# Patient Record
Sex: Female | Born: 1965 | Race: Black or African American | Hispanic: No | Smoking: Never smoker
Health system: Southern US, Community
[De-identification: ages and names within clinical notes are randomized; demographics above are authoritative.]

## PROBLEM LIST (undated history)

## (undated) DIAGNOSIS — T8859XA Other complications of anesthesia, initial encounter: Secondary | ICD-10-CM

## (undated) DIAGNOSIS — D241 Benign neoplasm of right breast: Secondary | ICD-10-CM

## (undated) DIAGNOSIS — R519 Headache, unspecified: Secondary | ICD-10-CM

## (undated) DIAGNOSIS — N938 Other specified abnormal uterine and vaginal bleeding: Secondary | ICD-10-CM

## (undated) DIAGNOSIS — M1990 Unspecified osteoarthritis, unspecified site: Secondary | ICD-10-CM

## (undated) DIAGNOSIS — D649 Anemia, unspecified: Secondary | ICD-10-CM

## (undated) DIAGNOSIS — R112 Nausea with vomiting, unspecified: Secondary | ICD-10-CM

## (undated) DIAGNOSIS — K219 Gastro-esophageal reflux disease without esophagitis: Secondary | ICD-10-CM

## (undated) DIAGNOSIS — Z9889 Other specified postprocedural states: Secondary | ICD-10-CM

## (undated) DIAGNOSIS — D0511 Intraductal carcinoma in situ of right breast: Secondary | ICD-10-CM

## (undated) DIAGNOSIS — E785 Hyperlipidemia, unspecified: Secondary | ICD-10-CM

## (undated) DIAGNOSIS — I10 Essential (primary) hypertension: Secondary | ICD-10-CM

## (undated) DIAGNOSIS — E7800 Pure hypercholesterolemia, unspecified: Secondary | ICD-10-CM

## (undated) DIAGNOSIS — K589 Irritable bowel syndrome without diarrhea: Secondary | ICD-10-CM

## (undated) HISTORY — PX: ABDOMINAL HYSTERECTOMY: SHX81

## (undated) HISTORY — DX: Irritable bowel syndrome, unspecified: K58.9

## (undated) HISTORY — DX: Intraductal carcinoma in situ of right breast: D05.11

## (undated) HISTORY — PX: COLONOSCOPY: SHX174

## (undated) HISTORY — DX: Benign neoplasm of right breast: D24.1

## (undated) HISTORY — PX: BREAST BIOPSY: SHX20

---

## 1984-12-21 HISTORY — PX: BREAST BIOPSY: SHX20

## 2009-12-21 HISTORY — PX: COLONOSCOPY: SHX174

## 2011-12-22 HISTORY — PX: BREAST EXCISIONAL BIOPSY: SUR124

## 2012-01-21 DIAGNOSIS — D249 Benign neoplasm of unspecified breast: Secondary | ICD-10-CM

## 2012-01-21 HISTORY — DX: Benign neoplasm of unspecified breast: D24.9

## 2014-12-21 HISTORY — PX: ABDOMINAL HYSTERECTOMY: SHX81

## 2016-11-23 ENCOUNTER — Other Ambulatory Visit: Payer: Self-pay | Admitting: Family Medicine

## 2016-11-23 DIAGNOSIS — Z1231 Encounter for screening mammogram for malignant neoplasm of breast: Secondary | ICD-10-CM

## 2016-12-17 ENCOUNTER — Ambulatory Visit
Admission: RE | Admit: 2016-12-17 | Discharge: 2016-12-17 | Disposition: A | Discharge status: Home / Self Care | Payer: BLUE CROSS/BLUE SHIELD | Source: Ambulatory Visit | Attending: Family Medicine | Admitting: Family Medicine

## 2016-12-17 DIAGNOSIS — Z1231 Encounter for screening mammogram for malignant neoplasm of breast: Secondary | ICD-10-CM | POA: Insufficient documentation

## 2016-12-25 ENCOUNTER — Inpatient Hospital Stay
Admission: RE | Admit: 2016-12-25 | Discharge: 2016-12-25 | Disposition: A | Discharge status: Home / Self Care | Payer: Self-pay | Source: Ambulatory Visit | Attending: *Deleted | Admitting: *Deleted

## 2016-12-25 ENCOUNTER — Other Ambulatory Visit: Payer: Self-pay | Admitting: *Deleted

## 2016-12-25 DIAGNOSIS — Z9289 Personal history of other medical treatment: Secondary | ICD-10-CM

## 2018-02-11 ENCOUNTER — Other Ambulatory Visit: Payer: Self-pay | Admitting: Internal Medicine

## 2018-02-11 DIAGNOSIS — Z1239 Encounter for other screening for malignant neoplasm of breast: Secondary | ICD-10-CM

## 2020-02-21 ENCOUNTER — Other Ambulatory Visit: Payer: Self-pay | Admitting: Infectious Diseases

## 2020-02-21 DIAGNOSIS — Z1231 Encounter for screening mammogram for malignant neoplasm of breast: Secondary | ICD-10-CM

## 2020-03-07 ENCOUNTER — Ambulatory Visit
Admission: RE | Admit: 2020-03-07 | Discharge: 2020-03-07 | Disposition: A | Discharge status: Home / Self Care | Payer: BC Managed Care – PPO | Source: Ambulatory Visit | Attending: Infectious Diseases | Admitting: Infectious Diseases

## 2020-03-07 DIAGNOSIS — Z1231 Encounter for screening mammogram for malignant neoplasm of breast: Secondary | ICD-10-CM | POA: Insufficient documentation

## 2020-03-08 ENCOUNTER — Other Ambulatory Visit: Payer: Self-pay | Admitting: Family Medicine

## 2020-03-14 ENCOUNTER — Other Ambulatory Visit: Payer: Self-pay | Admitting: Family Medicine

## 2020-03-15 ENCOUNTER — Other Ambulatory Visit: Payer: Self-pay | Admitting: Infectious Diseases

## 2020-03-15 DIAGNOSIS — N6452 Nipple discharge: Secondary | ICD-10-CM

## 2020-04-11 ENCOUNTER — Ambulatory Visit
Admission: RE | Admit: 2020-04-11 | Discharge: 2020-04-11 | Disposition: A | Discharge status: Home / Self Care | Payer: BC Managed Care – PPO | Source: Ambulatory Visit | Attending: Infectious Diseases | Admitting: Infectious Diseases

## 2020-04-11 DIAGNOSIS — N6452 Nipple discharge: Secondary | ICD-10-CM | POA: Diagnosis not present

## 2020-04-15 ENCOUNTER — Other Ambulatory Visit: Payer: Self-pay | Admitting: Infectious Diseases

## 2020-04-15 DIAGNOSIS — N6452 Nipple discharge: Secondary | ICD-10-CM

## 2020-04-19 DIAGNOSIS — K635 Polyp of colon: Secondary | ICD-10-CM

## 2020-04-19 HISTORY — DX: Polyp of colon: K63.5

## 2020-04-22 ENCOUNTER — Other Ambulatory Visit: Payer: Self-pay | Admitting: Infectious Diseases

## 2020-04-22 DIAGNOSIS — R928 Other abnormal and inconclusive findings on diagnostic imaging of breast: Secondary | ICD-10-CM

## 2020-04-22 DIAGNOSIS — N6320 Unspecified lump in the left breast, unspecified quadrant: Secondary | ICD-10-CM

## 2020-04-29 ENCOUNTER — Ambulatory Visit
Admission: RE | Admit: 2020-04-29 | Discharge: 2020-04-29 | Disposition: A | Discharge status: Home / Self Care | Payer: BC Managed Care – PPO | Source: Ambulatory Visit | Attending: Infectious Diseases | Admitting: Infectious Diseases

## 2020-04-29 ENCOUNTER — Other Ambulatory Visit: Payer: Self-pay

## 2020-04-29 DIAGNOSIS — N6452 Nipple discharge: Secondary | ICD-10-CM | POA: Insufficient documentation

## 2020-04-29 MED ORDER — GADOBUTROL 1 MMOL/ML IV SOLN
5.0000 mL | Freq: Once | INTRAVENOUS | Status: AC | PRN
  Administered 2020-04-29: 5 mL via INTRAVENOUS

## 2020-05-09 ENCOUNTER — Other Ambulatory Visit: Payer: Self-pay | Admitting: Infectious Diseases

## 2020-05-09 DIAGNOSIS — R9389 Abnormal findings on diagnostic imaging of other specified body structures: Secondary | ICD-10-CM

## 2020-05-17 ENCOUNTER — Ambulatory Visit
Admission: RE | Admit: 2020-05-17 | Discharge: 2020-05-17 | Disposition: A | Discharge status: Home / Self Care | Payer: BC Managed Care – PPO | Source: Ambulatory Visit | Attending: Infectious Diseases | Admitting: Infectious Diseases

## 2020-05-17 ENCOUNTER — Other Ambulatory Visit: Payer: Self-pay | Admitting: Neurology

## 2020-05-17 ENCOUNTER — Other Ambulatory Visit: Payer: Self-pay

## 2020-05-17 DIAGNOSIS — R9389 Abnormal findings on diagnostic imaging of other specified body structures: Secondary | ICD-10-CM

## 2020-05-17 HISTORY — PX: BREAST BIOPSY: SHX20

## 2020-05-17 MED ORDER — GADOBUTROL 1 MMOL/ML IV SOLN
6.0000 mL | Freq: Once | INTRAVENOUS | Status: AC | PRN
  Administered 2020-05-17: 6 mL via INTRAVENOUS

## 2020-05-26 HISTORY — PX: BREAST EXCISIONAL BIOPSY: SUR124

## 2020-05-28 ENCOUNTER — Ambulatory Visit: Payer: Self-pay | Admitting: General Surgery

## 2020-05-28 NOTE — H&P (Signed)
PATIENT PROFILE: Jacqueline Murray is a 54 y.o. female who presents to the Clinic for consultation at the request of Dr. Ola Spurr for evaluation of intraductal papilloma of the right breast.  PCP:  Angelena Form, MD  HISTORY OF PRESENT ILLNESS: Jacqueline Murray reports she has been having right nipple discharge for the last few months.  She reports that the discharge is clear [place].  She denies any pain.  There is no pain radiation.  There is no alleviating or aggravating factor.  The discharge is spontaneous mostly seen on her bra or pajamas.  Denies any skin changes.  Denies any palpable mass.  Due to this complaint she had diagnostic mammogram and ultrasound.  This did not show anything that explain the nipple discharge.  MRI was recommended.  MRI showed a lesion at the 9 o'clock position.  MRI guided biopsy was done showing intraductal papilloma with hyperplasia.  No atypia or malignancy.  Family history of breast cancer: None Family history of other cancers: Father prostate Menarche: 9 years old Menopause: 54 years old (hysterectomy due to fibroids and bleeding) Used OCP: Yes Used estrogen and progesterone therapy: Estradiol History of Radiation to the chest: None Previous breast biopsy: 3 on right breast, 1 left breast Age of first pregnancy: 54 years old (miscarriage) Number of points in total: 2  PROBLEM LIST:        Problem List  Date Reviewed: 09/29/2019       Noted   Anemia 04/19/2020   Colon polyp 04/19/2020   Overview    Benign      Dysfunctional uterine bleeding 04/19/2020   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   IBS (irritable bowel syndrome) 04/19/2020   Migraine 04/19/2020   Uterine leiomyoma 04/19/2020   Essential hypertension 02/11/2018   Pure hypercholesterolemia 02/11/2018   Hot flashes, menopausal 02/11/2018   Non-seasonal allergic rhinitis 02/11/2018   Gastroesophageal reflux disease without esophagitis 02/11/2018   S/P TAH-BSO 01/29/2015   Acne  11/21/2012   Intraductal papilloma of breast 01/21/2012   Overview    Left breast         GENERAL REVIEW OF SYSTEMS:   General ROS: negative for - chills, fatigue, fever, weight gain or weight loss Allergy and Immunology ROS: negative for - hives  Hematological and Lymphatic ROS: negative for - bleeding problems or bruising, negative for palpable nodes Endocrine ROS: negative for - heat or cold intolerance, hair changes Respiratory ROS: negative for - cough, shortness of breath or wheezing Cardiovascular ROS: no chest pain or palpitations GI ROS: negative for nausea, vomiting, abdominal pain, diarrhea, constipation Musculoskeletal ROS: negative for - joint swelling or muscle pain Neurological ROS: negative for - confusion, syncope Dermatological ROS: negative for pruritus and rash Psychiatric: negative for anxiety, depression, difficulty sleeping and memory loss  MEDICATIONS: Current Medications        Current Outpatient Medications  Medication Sig Dispense Refill  . estradioL (ESTRACE) 1 MG tablet Take 1 tablet (1 mg total) by mouth once daily 90 tablet 3  . loratadine (CLARITIN) 10 mg tablet Take 10 mg by mouth once daily      . montelukast (SINGULAIR) 10 mg tablet Take 1 tablet (10 mg total) by mouth nightly 90 tablet 1  . multivit/folic acid/vit K1 (ONE-A-DAY WOMEN'S 50 PLUS ORAL) Take 1 tablet by mouth once daily    . omeprazole (PRILOSEC) 20 MG DR capsule Take 20 mg by mouth once daily    . simvastatin (ZOCOR) 20 MG tablet Take 1 tablet (20 mg  total) by mouth nightly 90 tablet 3  . spironolactone (ALDACTONE) 100 MG tablet Take 1 tablet (100 mg total) by mouth once daily 90 tablet 3  . triamcinolone (NASACORT) 55 mcg nasal spray as needed     No current facility-administered medications for this visit.      ALLERGIES: Ace inhibitors, Povidone-iodine, Shellfish containing products, Latex, and Ramipril  PAST MEDICAL HISTORY:     Past Medical  History:  Diagnosis Date  . Allergy 2004  . Anemia 2000  . Arthritis   . GERD (gastroesophageal reflux disease) 2004  . History of blood transfusion 2000  . Hyperlipidemia   . Hypertension 2005    PAST SURGICAL HISTORY:      Past Surgical History:  Procedure Laterality Date  . CESAREAN SECTION  06/17/1994  . COLONOSCOPY  03/13/2010   @ [facility] - "benign polyps"  . HYSTERECTOMY  01/28/2015     FAMILY HISTORY:      Family History  Problem Relation Age of Onset  . Heart disease Mother   . Diabetes type I Mother   . Coronary Artery Disease (Blocked arteries around heart) Mother        Deceased  . Diabetes type II Mother        Deceased  . Diabetes Mother   . Prostate cancer Father        Deceased  . High blood pressure (Hypertension) Father        Deceased  . Hyperlipidemia (Elevated cholesterol) Father        Deceased  . High blood pressure (Hypertension) Sister   . Hyperlipidemia (Elevated cholesterol) Sister   . Heart disease Brother   . Diabetes type I Brother   . Kidney disease Brother   . Coronary Artery Disease (Blocked arteries around heart) Brother        Deceased  . Diabetes type II Brother        Deceased  . Diabetes Brother   . No Known Problems Brother   . No Known Problems Brother   . No Known Problems Brother      SOCIAL HISTORY: Social History          Socioeconomic History  . Marital status: Divorced    Spouse name: Not on file  . Number of children: 1  . Years of education: 57  . Highest education level: Master's degree (e.g., MA, MS, MEng, MEd, MSW, MBA)  Occupational History  . Occupation: Geologist, engineering    Comment: Geophysical data processor  Tobacco Use  . Smoking status: Never Smoker  . Smokeless tobacco: Never Used  Vaping Use  . Vaping Use: Never used  Substance and Sexual Activity  . Alcohol use: Not Currently  . Drug use: Never  . Sexual activity: Not Currently    Partners: Male     Birth control/protection: None  Other Topics Concern  . Not on file  Social History Narrative  . Not on file   Social Determinants of Health      Financial Resource Strain:   . Difficulty of Paying Living Expenses:   Food Insecurity:   . Worried About Charity fundraiser in the Last Year:   . Arboriculturist in the Last Year:   Transportation Needs:   . Film/video editor (Medical):   Marland Kitchen Lack of Transportation (Non-Medical):       PHYSICAL EXAM:    Vitals:   05/28/20 1005  BP: 115/82  Pulse: 73   Body mass index  is 24.14 kg/m. Weight: 59.9 kg (132 lb)   GENERAL: Alert, active, oriented x3  HEENT: Pupils equal reactive to light. Extraocular movements are intact. Sclera clear. Palpebral conjunctiva normal red color.Pharynx clear.  NECK: Supple with no palpable mass and no adenopathy.  LUNGS: Sound clear with no rales rhonchi or wheezes.  HEART: Regular rhythm S1 and S2 without murmur.  BREAST: breasts appear normal, no suspicious masses, no skin or nipple changes or axillary nodes.  ABDOMEN: Soft and depressible, nontender with no palpable mass, no hepatomegaly.  EXTREMITIES: Well-developed well-nourished symmetrical with no dependent edema.  NEUROLOGICAL: Awake alert oriented, facial expression symmetrical, moving all extremities.  REVIEW OF DATA: I have reviewed the following data today:      Appointment on 03/18/2020  Component Date Value  . WBC (White Blood Cell Co* 03/18/2020 6.1   . RBC (Red Blood Cell Coun* 03/18/2020 4.20   . Hemoglobin 03/18/2020 11.9*  . Hematocrit 03/18/2020 37.7   . MCV (Mean Corpuscular Vo* 03/18/2020 89.8   . MCH (Mean Corpuscular He* 03/18/2020 28.3   . MCHC (Mean Corpuscular H* 03/18/2020 31.6*  . Platelet Count 03/18/2020 209   . RDW-CV (Red Cell Distrib* 03/18/2020 12.9   . MPV (Mean Platelet Volum* 03/18/2020 11.4   . Neutrophils 03/18/2020 3.18   . Lymphocytes 03/18/2020 2.09   . Monocytes  03/18/2020 0.57   . Eosinophils 03/18/2020 0.22   . Basophils 03/18/2020 0.03   . Neutrophil % 03/18/2020 52.1   . Lymphocyte % 03/18/2020 34.3   . Monocyte % 03/18/2020 9.3   . Eosinophil % 03/18/2020 3.6   . Basophil% 03/18/2020 0.5   . Immature Granulocyte % 03/18/2020 0.2   . Immature Granulocyte Cou* 03/18/2020 0.01   . Glucose 03/18/2020 95   . Sodium 03/18/2020 139   . Potassium 03/18/2020 4.3   . Chloride 03/18/2020 108   . Carbon Dioxide (CO2) 03/18/2020 24.3   . Urea Nitrogen (BUN) 03/18/2020 18   . Creatinine 03/18/2020 1.0   . Glomerular Filtration Ra* 03/18/2020 70   . Calcium 03/18/2020 9.5   . AST  03/18/2020 24   . ALT  03/18/2020 23   . Alk Phos (alkaline Phosp* 03/18/2020 49   . Albumin 03/18/2020 4.2   . Bilirubin, Total 03/18/2020 0.9   . Protein, Total 03/18/2020 6.8   . A/G Ratio 03/18/2020 1.6   . Cholesterol, Total 03/18/2020 165   . Triglyceride 03/18/2020 87   . HDL (High Density Lipopr* 03/18/2020 46.9   . LDL Calculated 03/18/2020 101   . VLDL Cholesterol 03/18/2020 17   . Cholesterol/HDL Ratio 03/18/2020 3.5   . Thyroid Stimulating Horm* 03/18/2020 1.755      ASSESSMENT: Jacqueline Murray is a 54 y.o. female presenting for consultation for right breast intraductal papilloma.    Patient was oriented again about the pathology results. Surgical alternatives were discussed with patient including excisional biopsy. Surgical technique and post operative care was discussed with patient. Risk of surgery was discussed with patient including but not limited to: wound infection, seroma, hematoma, brachial plexopathy, mondor's disease (thrombosis of small veins of breast), chronic wound pain, breast lymphedema, altered sensation to the nipple and cosmesis among others.   Due to patient persistent discharge from the right nipple and the finding of intraductal papilloma excisional biopsy was recommended.  Patient oriented about the goal of ruling out any malignancy  and to decrease or resolve the nipple discharge.  Patient reports he understood and agreed to proceed.  Intraductal  papilloma of right breast [D24.1]  PLAN: 1. Right breast radiofrequency tag guided excisional biopsy 2. CBC, CMP done 3. Avoid taking aspirin 5 days before surgery 4. Contact us if has any question or concern.   Patient verbalized understanding, all questions were answered, and were agreeable with the plan outlined above.     Herbert Pun, MD  Electronically signed by Herbert Pun, MD

## 2020-05-28 NOTE — H&P (View-Only) (Signed)
PATIENT PROFILE: Jacqueline Murray is a 54 y.o. female who presents to the Clinic for consultation at the request of Dr. Ola Murray for evaluation of intraductal papilloma of the right breast.  PCP:  Jacqueline Form, MD  HISTORY OF PRESENT ILLNESS: Ms. Jacqueline Murray reports she has been having right nipple discharge for the last few months.  She reports that the discharge is clear [place].  She denies any pain.  There is no pain radiation.  There is no alleviating or aggravating factor.  The discharge is spontaneous mostly seen on her bra or pajamas.  Denies any skin changes.  Denies any palpable mass.  Due to this complaint she had diagnostic mammogram and ultrasound.  This did not show anything that explain the nipple discharge.  MRI was recommended.  MRI showed a lesion at the 9 o'clock position.  MRI guided biopsy was done showing intraductal papilloma with hyperplasia.  No atypia or malignancy.  Family history of breast cancer: None Family history of other cancers: Father prostate Menarche: 45 years old Menopause: 54 years old (hysterectomy due to fibroids and bleeding) Used OCP: Yes Used estrogen and progesterone therapy: Estradiol History of Radiation to the chest: None Previous breast biopsy: 3 on right breast, 1 left breast Age of first pregnancy: 54 years old (miscarriage) Number of points in total: 2  PROBLEM LIST:        Problem List  Date Reviewed: 09/29/2019       Noted   Anemia 04/19/2020   Colon polyp 04/19/2020   Overview    Benign      Dysfunctional uterine bleeding 04/19/2020   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   IBS (irritable bowel syndrome) 04/19/2020   Migraine 04/19/2020   Uterine leiomyoma 04/19/2020   Essential hypertension 02/11/2018   Pure hypercholesterolemia 02/11/2018   Hot flashes, menopausal 02/11/2018   Non-seasonal allergic rhinitis 02/11/2018   Gastroesophageal reflux disease without esophagitis 02/11/2018   S/P TAH-BSO 01/29/2015   Acne  11/21/2012   Intraductal papilloma of breast 01/21/2012   Overview    Left breast         GENERAL REVIEW OF SYSTEMS:   General ROS: negative for - chills, fatigue, fever, weight gain or weight loss Allergy and Immunology ROS: negative for - hives  Hematological and Lymphatic ROS: negative for - bleeding problems or bruising, negative for palpable nodes Endocrine ROS: negative for - heat or cold intolerance, hair changes Respiratory ROS: negative for - cough, shortness of breath or wheezing Cardiovascular ROS: no chest pain or palpitations GI ROS: negative for nausea, vomiting, abdominal pain, diarrhea, constipation Musculoskeletal ROS: negative for - joint swelling or muscle pain Neurological ROS: negative for - confusion, syncope Dermatological ROS: negative for pruritus and rash Psychiatric: negative for anxiety, depression, difficulty sleeping and memory loss  MEDICATIONS: Current Medications        Current Outpatient Medications  Medication Sig Dispense Refill  . estradioL (ESTRACE) 1 MG tablet Take 1 tablet (1 mg total) by mouth once daily 90 tablet 3  . loratadine (CLARITIN) 10 mg tablet Take 10 mg by mouth once daily      . montelukast (SINGULAIR) 10 mg tablet Take 1 tablet (10 mg total) by mouth nightly 90 tablet 1  . multivit/folic acid/vit K1 (ONE-A-DAY WOMEN'S 50 PLUS ORAL) Take 1 tablet by mouth once daily    . omeprazole (PRILOSEC) 20 MG DR capsule Take 20 mg by mouth once daily    . simvastatin (ZOCOR) 20 MG tablet Take 1 tablet (20 mg  total) by mouth nightly 90 tablet 3  . spironolactone (ALDACTONE) 100 MG tablet Take 1 tablet (100 mg total) by mouth once daily 90 tablet 3  . triamcinolone (NASACORT) 55 mcg nasal spray as needed     No current facility-administered medications for this visit.      ALLERGIES: Ace inhibitors, Povidone-iodine, Shellfish containing products, Latex, and Ramipril  PAST MEDICAL HISTORY:     Past Medical  History:  Diagnosis Date  . Allergy 2004  . Anemia 2000  . Arthritis   . GERD (gastroesophageal reflux disease) 2004  . History of blood transfusion 2000  . Hyperlipidemia   . Hypertension 2005    PAST SURGICAL HISTORY:      Past Surgical History:  Procedure Laterality Date  . CESAREAN SECTION  06/17/1994  . COLONOSCOPY  03/13/2010   @ [facility] - "benign polyps"  . HYSTERECTOMY  01/28/2015     FAMILY HISTORY:      Family History  Problem Relation Age of Onset  . Heart disease Mother   . Diabetes type I Mother   . Coronary Artery Disease (Blocked arteries around heart) Mother        Deceased  . Diabetes type II Mother        Deceased  . Diabetes Mother   . Prostate cancer Father        Deceased  . High blood pressure (Hypertension) Father        Deceased  . Hyperlipidemia (Elevated cholesterol) Father        Deceased  . High blood pressure (Hypertension) Sister   . Hyperlipidemia (Elevated cholesterol) Sister   . Heart disease Brother   . Diabetes type I Brother   . Kidney disease Brother   . Coronary Artery Disease (Blocked arteries around heart) Brother        Deceased  . Diabetes type II Brother        Deceased  . Diabetes Brother   . No Known Problems Brother   . No Known Problems Brother   . No Known Problems Brother      SOCIAL HISTORY: Social History          Socioeconomic History  . Marital status: Divorced    Spouse name: Not on file  . Number of children: 1  . Years of education: 52  . Highest education level: Master's degree (e.g., MA, MS, MEng, MEd, MSW, MBA)  Occupational History  . Occupation: Geologist, engineering    Comment: Geophysical data processor  Tobacco Use  . Smoking status: Never Smoker  . Smokeless tobacco: Never Used  Vaping Use  . Vaping Use: Never used  Substance and Sexual Activity  . Alcohol use: Not Currently  . Drug use: Never  . Sexual activity: Not Currently    Partners: Male     Birth control/protection: None  Other Topics Concern  . Not on file  Social History Narrative  . Not on file   Social Determinants of Health      Financial Resource Strain:   . Difficulty of Paying Living Expenses:   Food Insecurity:   . Worried About Charity fundraiser in the Last Year:   . Arboriculturist in the Last Year:   Transportation Needs:   . Film/video editor (Medical):   Marland Kitchen Lack of Transportation (Non-Medical):       PHYSICAL EXAM:    Vitals:   05/28/20 1005  BP: 115/82  Pulse: 73   Body mass index  is 24.14 kg/m. Weight: 59.9 kg (132 lb)   GENERAL: Alert, active, oriented x3  HEENT: Pupils equal reactive to light. Extraocular movements are intact. Sclera clear. Palpebral conjunctiva normal red color.Pharynx clear.  NECK: Supple with no palpable mass and no adenopathy.  LUNGS: Sound clear with no rales rhonchi or wheezes.  HEART: Regular rhythm S1 and S2 without murmur.  BREAST: breasts appear normal, no suspicious masses, no skin or nipple changes or axillary nodes.  ABDOMEN: Soft and depressible, nontender with no palpable mass, no hepatomegaly.  EXTREMITIES: Well-developed well-nourished symmetrical with no dependent edema.  NEUROLOGICAL: Awake alert oriented, facial expression symmetrical, moving all extremities.  REVIEW OF DATA: I have reviewed the following data today:      Appointment on 03/18/2020  Component Date Value  . WBC (White Blood Cell Co* 03/18/2020 6.1   . RBC (Red Blood Cell Coun* 03/18/2020 4.20   . Hemoglobin 03/18/2020 11.9*  . Hematocrit 03/18/2020 37.7   . MCV (Mean Corpuscular Vo* 03/18/2020 89.8   . MCH (Mean Corpuscular He* 03/18/2020 28.3   . MCHC (Mean Corpuscular H* 03/18/2020 31.6*  . Platelet Count 03/18/2020 209   . RDW-CV (Red Cell Distrib* 03/18/2020 12.9   . MPV (Mean Platelet Volum* 03/18/2020 11.4   . Neutrophils 03/18/2020 3.18   . Lymphocytes 03/18/2020 2.09   . Monocytes  03/18/2020 0.57   . Eosinophils 03/18/2020 0.22   . Basophils 03/18/2020 0.03   . Neutrophil % 03/18/2020 52.1   . Lymphocyte % 03/18/2020 34.3   . Monocyte % 03/18/2020 9.3   . Eosinophil % 03/18/2020 3.6   . Basophil% 03/18/2020 0.5   . Immature Granulocyte % 03/18/2020 0.2   . Immature Granulocyte Cou* 03/18/2020 0.01   . Glucose 03/18/2020 95   . Sodium 03/18/2020 139   . Potassium 03/18/2020 4.3   . Chloride 03/18/2020 108   . Carbon Dioxide (CO2) 03/18/2020 24.3   . Urea Nitrogen (BUN) 03/18/2020 18   . Creatinine 03/18/2020 1.0   . Glomerular Filtration Ra* 03/18/2020 70   . Calcium 03/18/2020 9.5   . AST  03/18/2020 24   . ALT  03/18/2020 23   . Alk Phos (alkaline Phosp* 03/18/2020 49   . Albumin 03/18/2020 4.2   . Bilirubin, Total 03/18/2020 0.9   . Protein, Total 03/18/2020 6.8   . A/G Ratio 03/18/2020 1.6   . Cholesterol, Total 03/18/2020 165   . Triglyceride 03/18/2020 87   . HDL (High Density Lipopr* 03/18/2020 46.9   . LDL Calculated 03/18/2020 101   . VLDL Cholesterol 03/18/2020 17   . Cholesterol/HDL Ratio 03/18/2020 3.5   . Thyroid Stimulating Horm* 03/18/2020 1.755      ASSESSMENT: Jacqueline Murray is a 54 y.o. female presenting for consultation for right breast intraductal papilloma.    Patient was oriented again about the pathology results. Surgical alternatives were discussed with patient including excisional biopsy. Surgical technique and post operative care was discussed with patient. Risk of surgery was discussed with patient including but not limited to: wound infection, seroma, hematoma, brachial plexopathy, mondor's disease (thrombosis of small veins of breast), chronic wound pain, breast lymphedema, altered sensation to the nipple and cosmesis among others.   Due to patient persistent discharge from the right nipple and the finding of intraductal papilloma excisional biopsy was recommended.  Patient oriented about the goal of ruling out any malignancy  and to decrease or resolve the nipple discharge.  Patient reports he understood and agreed to proceed.  Intraductal  papilloma of right breast [D24.1]  PLAN: 1. Right breast radiofrequency tag guided excisional biopsy 2. CBC, CMP done 3. Avoid taking aspirin 5 days before surgery 4. Contact us if has any question or concern.   Patient verbalized understanding, all questions were answered, and were agreeable with the plan outlined above.     Herbert Pun, MD  Electronically signed by Herbert Pun, MD

## 2020-05-29 ENCOUNTER — Other Ambulatory Visit: Payer: Self-pay | Admitting: General Surgery

## 2020-05-29 DIAGNOSIS — D241 Benign neoplasm of right breast: Secondary | ICD-10-CM

## 2020-05-31 ENCOUNTER — Encounter
Admission: RE | Admit: 2020-05-31 | Discharge: 2020-05-31 | Disposition: A | Discharge status: Home / Self Care | Payer: BC Managed Care – PPO | Source: Ambulatory Visit | Attending: General Surgery | Admitting: General Surgery

## 2020-05-31 ENCOUNTER — Other Ambulatory Visit: Payer: Self-pay

## 2020-05-31 HISTORY — DX: Essential (primary) hypertension: I10

## 2020-05-31 HISTORY — DX: Nausea with vomiting, unspecified: R11.2

## 2020-05-31 HISTORY — DX: Other complications of anesthesia, initial encounter: T88.59XA

## 2020-05-31 HISTORY — DX: Hyperlipidemia, unspecified: E78.5

## 2020-05-31 HISTORY — DX: Other specified postprocedural states: Z98.890

## 2020-05-31 HISTORY — DX: Gastro-esophageal reflux disease without esophagitis: K21.9

## 2020-05-31 HISTORY — DX: Anemia, unspecified: D64.9

## 2020-05-31 NOTE — Patient Instructions (Signed)
Your procedure is scheduled on: 06/05/20 Report to [facility]. To find out your arrival time please call [phone] between 1PM - 3PM on 06/04/20.  Remember: Instructions that are not followed completely may result in serious medical risk, up to and including death, or upon the discretion of your surgeon and anesthesiologist your surgery may need to be rescheduled.     _X__ 1. Do not eat food after midnight the night before your procedure.                 No gum chewing or hard candies. You may drink clear liquids up to 2 hours                 before you are scheduled to arrive for your surgery- DO not drink clear                 liquids within 2 hours of the start of your surgery.                 Clear Liquids include:  water, apple juice without pulp, clear carbohydrate                 drink such as Clearfast or Gatorade, Black Coffee or Tea (Do not add                 anything to coffee or tea). Diabetics water only  __X__2.  On the morning of surgery brush your teeth with toothpaste and water, you                 may rinse your mouth with mouthwash if you wish.  Do not swallow any              toothpaste of mouthwash.     _X__ 3.  No Alcohol for 24 hours before or after surgery.   _X__ 4.  Do Not Smoke or use e-cigarettes For 24 Hours Prior to Your Surgery.                 Do not use any chewable tobacco products for at least 6 hours prior to                 surgery.  ____  5.  Bring all medications with you on the day of surgery if instructed.   __X__  6.  Notify your doctor if there is any change in your medical condition      (cold, fever, infections).     Do not wear jewelry, make-up, hairpins, clips or nail polish. Do not wear lotions, powders, or perfumes.  Do not shave 48 hours prior to surgery. Men may shave face and neck. Do not bring valuables to the hospital.    [facility] is not responsible for any belongings or  valuables.  Contacts, dentures/partials or body piercings may not be worn into surgery. Bring a case for your contacts, glasses or hearing aids, a denture cup will be supplied. Leave your suitcase in the car. After surgery it may be brought to your room. For patients admitted to the hospital, discharge time is determined by your treatment team.   Patients discharged the day of surgery will not be allowed to drive home.   Please read over the following fact sheets that you were given:   MRSA Information  __X__ Take these medicines the morning of surgery with A SIP OF WATER:  1. omeprazole (PRILOSEC) 20 MG capsule  2.   3.   4.  5.  6.  ____ Fleet Enema (as directed)   __X__ Use CHG Soap/SAGE wipes as directed  ____ Use inhalers on the day of surgery  ____ Stop metformin/Janumet/Farxiga 2 days prior to surgery    ____ Take 1/2 of usual insulin dose the night before surgery. No insulin the morning          of surgery.   ____ Stop Blood Thinners Coumadin/Plavix/Xarelto/Pleta/Pradaxa/Eliquis/Effient/Aspirin  on   Or contact your Surgeon, Cardiologist or Medical Doctor regarding  ability to stop your blood thinners  __X__ Stop Anti-inflammatories 7 days before surgery such as Advil, Ibuprofen, Motrin,  BC or Goodies Powder, Naprosyn, Naproxen, Aleve, Aspirin    __X__ Stop all herbal supplements, fish oil or vitamin E until after surgery.    ____ Bring C-Pap to the hospital.

## 2020-06-03 ENCOUNTER — Other Ambulatory Visit: Payer: BC Managed Care – PPO

## 2020-06-03 ENCOUNTER — Other Ambulatory Visit: Payer: Self-pay

## 2020-06-03 ENCOUNTER — Other Ambulatory Visit
Admission: RE | Admit: 2020-06-03 | Discharge: 2020-06-03 | Disposition: A | Discharge status: Home / Self Care | Payer: BC Managed Care – PPO | Source: Ambulatory Visit | Attending: General Surgery | Admitting: General Surgery

## 2020-06-03 ENCOUNTER — Ambulatory Visit
Admission: RE | Admit: 2020-06-03 | Discharge: 2020-06-03 | Disposition: A | Discharge status: Home / Self Care | Payer: BC Managed Care – PPO | Source: Ambulatory Visit | Attending: General Surgery | Admitting: General Surgery

## 2020-06-03 DIAGNOSIS — D241 Benign neoplasm of right breast: Secondary | ICD-10-CM | POA: Diagnosis present

## 2020-06-03 DIAGNOSIS — Z20822 Contact with and (suspected) exposure to covid-19: Secondary | ICD-10-CM | POA: Diagnosis not present

## 2020-06-03 DIAGNOSIS — Z01818 Encounter for other preprocedural examination: Secondary | ICD-10-CM | POA: Insufficient documentation

## 2020-06-03 DIAGNOSIS — Z01810 Encounter for preprocedural cardiovascular examination: Secondary | ICD-10-CM | POA: Diagnosis not present

## 2020-06-04 LAB — SARS CORONAVIRUS 2 (TAT 6-24 HRS): SARS Coronavirus 2: NEGATIVE

## 2020-06-05 ENCOUNTER — Ambulatory Visit
Admission: RE | Admit: 2020-06-05 | Discharge: 2020-06-05 | Disposition: A | Discharge status: Home / Self Care | Payer: BC Managed Care – PPO | Source: Ambulatory Visit | Attending: General Surgery | Admitting: General Surgery

## 2020-06-05 ENCOUNTER — Other Ambulatory Visit: Payer: Self-pay

## 2020-06-05 ENCOUNTER — Ambulatory Visit: Payer: BC Managed Care – PPO | Admitting: Certified Registered"

## 2020-06-05 ENCOUNTER — Encounter
Admission: RE | Disposition: A | Discharge status: Home / Self Care | Payer: Self-pay | Source: Home / Self Care | Attending: General Surgery

## 2020-06-05 ENCOUNTER — Ambulatory Visit
Admission: RE | Admit: 2020-06-05 | Discharge: 2020-06-05 | Disposition: A | Discharge status: Home / Self Care | Payer: BC Managed Care – PPO | Attending: General Surgery | Admitting: General Surgery

## 2020-06-05 DIAGNOSIS — Z79890 Hormone replacement therapy (postmenopausal): Secondary | ICD-10-CM | POA: Diagnosis not present

## 2020-06-05 DIAGNOSIS — Z888 Allergy status to other drugs, medicaments and biological substances status: Secondary | ICD-10-CM | POA: Diagnosis not present

## 2020-06-05 DIAGNOSIS — Z79899 Other long term (current) drug therapy: Secondary | ICD-10-CM | POA: Insufficient documentation

## 2020-06-05 DIAGNOSIS — M1990 Unspecified osteoarthritis, unspecified site: Secondary | ICD-10-CM | POA: Diagnosis not present

## 2020-06-05 DIAGNOSIS — D241 Benign neoplasm of right breast: Secondary | ICD-10-CM | POA: Diagnosis not present

## 2020-06-05 DIAGNOSIS — E785 Hyperlipidemia, unspecified: Secondary | ICD-10-CM | POA: Diagnosis not present

## 2020-06-05 DIAGNOSIS — I10 Essential (primary) hypertension: Secondary | ICD-10-CM | POA: Insufficient documentation

## 2020-06-05 DIAGNOSIS — K219 Gastro-esophageal reflux disease without esophagitis: Secondary | ICD-10-CM | POA: Insufficient documentation

## 2020-06-05 DIAGNOSIS — E7800 Pure hypercholesterolemia, unspecified: Secondary | ICD-10-CM | POA: Diagnosis not present

## 2020-06-05 HISTORY — PX: BREAST BIOPSY WITH RADIO FREQUENCY LOCALIZER: SHX6895

## 2020-06-05 SURGERY — BREAST BIOPSY WITH RADIO FREQUENCY LOCALIZER
Anesthesia: General | Laterality: Right

## 2020-06-05 MED ORDER — FENTANYL CITRATE (PF) 100 MCG/2ML IJ SOLN
25.0000 ug | INTRAMUSCULAR | Status: DC | PRN
  Administered 2020-06-05 (×3): 25 ug via INTRAVENOUS

## 2020-06-05 MED ORDER — OXYCODONE HCL 5 MG/5ML PO SOLN: 5.0000 mg | Freq: Once | ORAL | Status: AC | PRN

## 2020-06-05 MED ORDER — LIDOCAINE HCL (CARDIAC) PF 100 MG/5ML IV SOSY
PREFILLED_SYRINGE | INTRAVENOUS | Status: DC | PRN
  Administered 2020-06-05: 30 mg via INTRAVENOUS

## 2020-06-05 MED ORDER — PHENYLEPHRINE HCL (PRESSORS) 10 MG/ML IV SOLN
INTRAVENOUS | Status: DC | PRN
  Administered 2020-06-05 (×3): 100 ug via INTRAVENOUS

## 2020-06-05 MED ORDER — FENTANYL CITRATE (PF) 100 MCG/2ML IJ SOLN
INTRAMUSCULAR | Status: AC
  Filled 2020-06-05: qty 2

## 2020-06-05 MED ORDER — SCOPOLAMINE 1 MG/3DAYS TD PT72: 1.0000 | MEDICATED_PATCH | Freq: Once | TRANSDERMAL | Status: DC

## 2020-06-05 MED ORDER — PROPOFOL 10 MG/ML IV BOLUS
INTRAVENOUS | Status: AC
  Filled 2020-06-05: qty 20

## 2020-06-05 MED ORDER — PROPOFOL 500 MG/50ML IV EMUL
INTRAVENOUS | Status: AC
  Filled 2020-06-05: qty 50

## 2020-06-05 MED ORDER — ACETAMINOPHEN 10 MG/ML IV SOLN
INTRAVENOUS | Status: DC | PRN
  Administered 2020-06-05: 1000 mg via INTRAVENOUS

## 2020-06-05 MED ORDER — BUPIVACAINE HCL (PF) 0.5 % IJ SOLN
INTRAMUSCULAR | Status: AC
  Filled 2020-06-05: qty 30

## 2020-06-05 MED ORDER — FENTANYL CITRATE (PF) 100 MCG/2ML IJ SOLN
INTRAMUSCULAR | Status: DC | PRN
  Administered 2020-06-05 (×2): 50 ug via INTRAVENOUS

## 2020-06-05 MED ORDER — EPINEPHRINE PF 1 MG/ML IJ SOLN
INTRAMUSCULAR | Status: AC
  Filled 2020-06-05: qty 1

## 2020-06-05 MED ORDER — CHLORHEXIDINE GLUCONATE 0.12 % MT SOLN: 15.0000 mL | Freq: Once | OROMUCOSAL | Status: AC

## 2020-06-05 MED ORDER — DEXAMETHASONE SODIUM PHOSPHATE 10 MG/ML IJ SOLN
INTRAMUSCULAR | Status: DC | PRN
  Administered 2020-06-05: 5 mg via INTRAVENOUS

## 2020-06-05 MED ORDER — ONDANSETRON HCL 4 MG/2ML IJ SOLN
INTRAMUSCULAR | Status: AC
  Filled 2020-06-05: qty 2

## 2020-06-05 MED ORDER — ACETAMINOPHEN 10 MG/ML IV SOLN
INTRAVENOUS | Status: AC
  Filled 2020-06-05: qty 100

## 2020-06-05 MED ORDER — ORAL CARE MOUTH RINSE: 15.0000 mL | Freq: Once | OROMUCOSAL | Status: AC

## 2020-06-05 MED ORDER — LIDOCAINE HCL (PF) 2 % IJ SOLN
INTRAMUSCULAR | Status: AC
  Filled 2020-06-05: qty 5

## 2020-06-05 MED ORDER — PROPOFOL 10 MG/ML IV BOLUS
INTRAVENOUS | Status: DC | PRN
  Administered 2020-06-05: 150 mg via INTRAVENOUS

## 2020-06-05 MED ORDER — SCOPOLAMINE 1 MG/3DAYS TD PT72
MEDICATED_PATCH | TRANSDERMAL | Status: AC
  Administered 2020-06-05: 1.5 mg via TRANSDERMAL
  Filled 2020-06-05: qty 1

## 2020-06-05 MED ORDER — MEPERIDINE HCL 50 MG/ML IJ SOLN: 6.2500 mg | INTRAMUSCULAR | Status: DC | PRN

## 2020-06-05 MED ORDER — DEXAMETHASONE SODIUM PHOSPHATE 10 MG/ML IJ SOLN
INTRAMUSCULAR | Status: AC
  Filled 2020-06-05: qty 1

## 2020-06-05 MED ORDER — OXYCODONE HCL 5 MG PO TABS
5.0000 mg | ORAL_TABLET | Freq: Once | ORAL | Status: AC | PRN
  Administered 2020-06-05: 5 mg via ORAL

## 2020-06-05 MED ORDER — ONDANSETRON 4 MG PO TBDP
4.0000 mg | ORAL_TABLET | Freq: Three times a day (TID) | ORAL | 0 refills | Status: DC | PRN
Start: 2020-06-05 — End: 2022-07-13

## 2020-06-05 MED ORDER — ONDANSETRON HCL 4 MG/2ML IJ SOLN
INTRAMUSCULAR | Status: DC | PRN
  Administered 2020-06-05: 4 mg via INTRAVENOUS

## 2020-06-05 MED ORDER — MIDAZOLAM HCL 2 MG/2ML IJ SOLN
INTRAMUSCULAR | Status: AC
  Filled 2020-06-05: qty 2

## 2020-06-05 MED ORDER — OXYCODONE HCL 5 MG PO TABS
ORAL_TABLET | ORAL | Status: AC
  Filled 2020-06-05: qty 1

## 2020-06-05 MED ORDER — LACTATED RINGERS IV SOLN: INTRAVENOUS | Status: DC

## 2020-06-05 MED ORDER — CEFAZOLIN SODIUM-DEXTROSE 2-4 GM/100ML-% IV SOLN
INTRAVENOUS | Status: AC
  Filled 2020-06-05: qty 100

## 2020-06-05 MED ORDER — CHLORHEXIDINE GLUCONATE 0.12 % MT SOLN
OROMUCOSAL | Status: AC
  Administered 2020-06-05: 15 mL via OROMUCOSAL
  Filled 2020-06-05: qty 15

## 2020-06-05 MED ORDER — FENTANYL CITRATE (PF) 100 MCG/2ML IJ SOLN
INTRAMUSCULAR | Status: AC
  Administered 2020-06-05: 25 ug via INTRAVENOUS
  Filled 2020-06-05: qty 2

## 2020-06-05 MED ORDER — HYDROCODONE-ACETAMINOPHEN 5-325 MG PO TABS: 1.0000 | ORAL_TABLET | ORAL | 0 refills | Status: AC | PRN

## 2020-06-05 MED ORDER — MIDAZOLAM HCL 2 MG/2ML IJ SOLN
INTRAMUSCULAR | Status: DC | PRN
  Administered 2020-06-05: 2 mg via INTRAVENOUS

## 2020-06-05 MED ORDER — BUPIVACAINE-EPINEPHRINE (PF) 0.5% -1:200000 IJ SOLN
INTRAMUSCULAR | Status: DC | PRN
  Administered 2020-06-05: 30 mL via PERINEURAL

## 2020-06-05 MED ORDER — PROMETHAZINE HCL 25 MG/ML IJ SOLN: 6.2500 mg | INTRAMUSCULAR | Status: DC | PRN

## 2020-06-05 MED ORDER — PROPOFOL 500 MG/50ML IV EMUL
INTRAVENOUS | Status: DC | PRN
  Administered 2020-06-05: 160 ug/kg/min via INTRAVENOUS

## 2020-06-05 MED ORDER — CEFAZOLIN SODIUM-DEXTROSE 2-4 GM/100ML-% IV SOLN
2.0000 g | INTRAVENOUS | Status: AC
  Administered 2020-06-05: 2 g via INTRAVENOUS

## 2020-06-05 SURGICAL SUPPLY — 44 items
BLADE SURG 15 STRL LF DISP TIS (BLADE) ×1 IMPLANT
BLADE SURG 15 STRL SS (BLADE) ×2
CANISTER SUCT 1200ML W/VALVE (MISCELLANEOUS) ×3 IMPLANT
CHLORAPREP W/TINT 26 (MISCELLANEOUS) ×3 IMPLANT
CNTNR SPEC 2.5X3XGRAD LEK (MISCELLANEOUS) ×1
CONT SPEC 4OZ STER OR WHT (MISCELLANEOUS) ×2
CONTAINER SPEC 2.5X3XGRAD LEK (MISCELLANEOUS) ×1 IMPLANT
COVER WAND RF STERILE (DRAPES) ×3 IMPLANT
DERMABOND ADVANCED (GAUZE/BANDAGES/DRESSINGS) ×2
DERMABOND ADVANCED .7 DNX12 (GAUZE/BANDAGES/DRESSINGS) ×1 IMPLANT
DEVICE DUBIN SPECIMEN MAMMOGRA (MISCELLANEOUS) ×3 IMPLANT
DRAPE LAPAROTOMY TRNSV 106X77 (MISCELLANEOUS) ×3 IMPLANT
ELECT CAUTERY BLADE TIP 2.5 (TIP) ×3
ELECT REM PT RETURN 9FT ADLT (ELECTROSURGICAL) ×3
ELECTRODE CAUTERY BLDE TIP 2.5 (TIP) ×1 IMPLANT
ELECTRODE REM PT RTRN 9FT ADLT (ELECTROSURGICAL) ×1 IMPLANT
GLOVE BIO SURGEON STRL SZ 6.5 (GLOVE) ×2 IMPLANT
GLOVE BIO SURGEONS STRL SZ 6.5 (GLOVE) ×1
GLOVE BIOGEL PI IND STRL 6.5 (GLOVE) ×1 IMPLANT
GLOVE BIOGEL PI INDICATOR 6.5 (GLOVE) ×2
GOWN STRL REUS W/ TWL LRG LVL3 (GOWN DISPOSABLE) ×3 IMPLANT
GOWN STRL REUS W/TWL LRG LVL3 (GOWN DISPOSABLE) ×6
KIT MARKER MARGIN INK (KITS) IMPLANT
KIT TURNOVER KIT A (KITS) ×3 IMPLANT
LABEL OR SOLS (LABEL) ×3 IMPLANT
MARGIN MAP 10MM (MISCELLANEOUS) IMPLANT
MARKER MARGIN CORRECT CLIP (MARKER) IMPLANT
NDL HYPO 25X1 1.5 SAFETY (NEEDLE) ×1 IMPLANT
NEEDLE HYPO 25X1 1.5 SAFETY (NEEDLE) ×3 IMPLANT
PACK BASIN MINOR (MISCELLANEOUS) ×3 IMPLANT
RETRACTOR RING XSMALL (MISCELLANEOUS) IMPLANT
RTRCTR WOUND ALEXIS 13CM XS SH (MISCELLANEOUS) ×3
SET LOCALIZER 20 PROBE US (MISCELLANEOUS) ×3 IMPLANT
SUT ETHILON 3-0 FS-10 30 BLK (SUTURE)
SUT MNCRL 4-0 (SUTURE) ×2
SUT MNCRL 4-0 27XMFL (SUTURE) ×1
SUT SILK 2 0 SH (SUTURE) ×3 IMPLANT
SUT VIC AB 3-0 SH 27 (SUTURE) ×4
SUT VIC AB 3-0 SH 27X BRD (SUTURE) ×1 IMPLANT
SUTURE EHLN 3-0 FS-10 30 BLK (SUTURE) IMPLANT
SUTURE MNCRL 4-0 27XMF (SUTURE) ×1 IMPLANT
SYR 10ML LL (SYRINGE) ×3 IMPLANT
SYR BULB IRRIG 60ML STRL (SYRINGE) ×3 IMPLANT
WATER STERILE IRR 1000ML POUR (IV SOLUTION) ×3 IMPLANT

## 2020-06-05 NOTE — Interval H&P Note (Signed)
History and Physical Interval Note:  06/05/2020 10:29 AM  Jacqueline Murray  has presented today for surgery, with the diagnosis of D24.1 Intraductal papilloma of rt breast.  The various methods of treatment have been discussed with the patient and family. After consideration of risks, benefits and other options for treatment, the patient has consented to  Procedure(s): BREAST BIOPSY WITH RADIO FREQUENCY LOCALIZER (Right) as a surgical intervention.  The patient's history has been reviewed, patient examined, no change in status, stable for surgery.  I have reviewed the patient's chart and labs.  Right chest marked in the pre procedure room. Questions were answered to the patient's satisfaction.     Herbert Pun

## 2020-06-05 NOTE — Anesthesia Procedure Notes (Signed)
Procedure Name: LMA Insertion Date/Time: 06/05/2020 11:12 AM Performed by: Lerry Liner, CRNA Pre-anesthesia Checklist: Patient identified, Emergency Drugs available, Suction available, Patient being monitored and Timeout performed Patient Re-evaluated:Patient Re-evaluated prior to induction Oxygen Delivery Method: Circle system utilized Preoxygenation: Pre-oxygenation with 100% oxygen Induction Type: IV induction Ventilation: Mask ventilation without difficulty LMA: LMA inserted LMA Size: 3.5 Number of attempts: 1 Tube secured with: Tape Dental Injury: Teeth and Oropharynx as per pre-operative assessment

## 2020-06-05 NOTE — Discharge Instructions (Signed)
°  Diet: Resume home heart healthy regular diet.  ° °Activity: Increase activity as tolerated. Light activity and walking are encouraged. Do not drive or drink alcohol if taking narcotic pain medications. ° °Wound care: May shower with soapy water and pat dry (do not rub incisions), but no baths or submerging incision underwater until follow-up. (no swimming)  ° °Medications: Resume all home medications. For mild to moderate pain: acetaminophen (Tylenol) or ibuprofen (if no kidney disease). Combining Tylenol with alcohol can substantially increase your risk of causing liver disease. Narcotic pain medications, if prescribed, can be used for severe pain, though may cause nausea, constipation, and drowsiness. Do not combine Tylenol and Norco within a 6 hour period as Norco contains Tylenol. If you do not need the narcotic pain medication, you do not need to fill the prescription. ° °Call office ([phone]) at any time if any questions, worsening pain, fevers/chills, bleeding, drainage from incision site, or other concerns. ° °AMBULATORY SURGERY  °DISCHARGE INSTRUCTIONS ° ° °1) The drugs that you were given will stay in your system until tomorrow so for the next 24 hours you should not: ° °A) Drive an automobile °B) Make any legal decisions °C) Drink any alcoholic beverage ° ° °2) You may resume regular meals tomorrow.  Today it is better to start with liquids and gradually work up to solid foods. ° °You may eat anything you prefer, but it is better to start with liquids, then soup and crackers, and gradually work up to solid foods. ° ° °3) Please notify your doctor immediately if you have any unusual bleeding, trouble breathing, redness and pain at the surgery site, drainage, fever, or pain not relieved by medication. ° ° ° °4) Additional Instructions: ° ° ° ° ° ° ° °Please contact your physician with any problems or Same Day Surgery at [phone], Monday through Friday 6 am to 4 pm, or [facility] [place] [facility]  number at [phone]. °

## 2020-06-05 NOTE — Anesthesia Preprocedure Evaluation (Signed)
Anesthesia Evaluation  Patient identified by MRN, date of birth, ID band Patient awake    Reviewed: Allergy & Precautions, NPO status , Patient's Chart, lab work & pertinent test results  History of Anesthesia Complications (+) PONV and history of anesthetic complications  Airway Mallampati: II  TM Distance: >3 FB Neck ROM: Full    Dental no notable dental hx.    Pulmonary neg pulmonary ROS, neg sleep apnea, neg COPD,    breath sounds clear to auscultation- rhonchi (-) wheezing      Cardiovascular hypertension, (-) CAD, (-) Past MI, (-) Cardiac Stents and (-) CABG  Rhythm:Regular Rate:Normal - Systolic murmurs and - Diastolic murmurs    Neuro/Psych neg Seizures negative neurological ROS  negative psych ROS   GI/Hepatic Neg liver ROS, GERD  ,  Endo/Other  negative endocrine ROSneg diabetes  Renal/GU negative Renal ROS     Musculoskeletal negative musculoskeletal ROS (+)   Abdominal (+) - obese,   Peds  Hematology  (+) anemia ,   Anesthesia Other Findings Past Medical History: No date: Anemia No date: Complication of anesthesia No date: GERD (gastroesophageal reflux disease) No date: HLD (hyperlipidemia) No date: Hypertension No date: PONV (postoperative nausea and vomiting)     Comment:  EXTREME NAUSEA   Reproductive/Obstetrics                             Anesthesia Physical Anesthesia Plan  ASA: II  Anesthesia Plan: General   Post-op Pain Management:    Induction: Intravenous  PONV Risk Score and Plan: 3 and TIVA, Midazolam and Ondansetron  Airway Management Planned: LMA  Additional Equipment:   Intra-op Plan:   Post-operative Plan:   Informed Consent: I have reviewed the patients History and Physical, chart, labs and discussed the procedure including the risks, benefits and alternatives for the proposed anesthesia with the patient or authorized representative who has  indicated his/her understanding and acceptance.     Dental advisory given  Plan Discussed with: CRNA and Anesthesiologist  Anesthesia Plan Comments:         Anesthesia Quick Evaluation

## 2020-06-05 NOTE — Anesthesia Postprocedure Evaluation (Signed)
Anesthesia Post Note  Patient: Jacqueline Murray  Procedure(s) Performed: BREAST BIOPSY WITH RADIO FREQUENCY LOCALIZER (Right )  Patient location during evaluation: PACU Anesthesia Type: General Level of consciousness: awake and alert and oriented Pain management: pain level controlled Vital Signs Assessment: post-procedure vital signs reviewed and stable Respiratory status: spontaneous breathing, nonlabored ventilation and respiratory function stable Cardiovascular status: blood pressure returned to baseline and stable Postop Assessment: no signs of nausea or vomiting Anesthetic complications: no   No complications documented.   Last Vitals:  Vitals:   06/05/20 1427 06/05/20 1504  BP: 130/76 117/72  Pulse: 61 (!) 58  Resp: 18 16  Temp: (!) 36.1 C   SpO2:  100%    Last Pain:  Vitals:   06/05/20 1504  TempSrc:   PainSc: 1                  Shahram Alexopoulos

## 2020-06-05 NOTE — Transfer of Care (Signed)
Immediate Anesthesia Transfer of Care Note  Patient: Jacqueline Murray  Procedure(s) Performed: BREAST BIOPSY WITH RADIO FREQUENCY LOCALIZER (Right )  Patient Location: PACU  Anesthesia Type:General  Level of Consciousness: drowsy  Airway & Oxygen Therapy: Patient Spontanous Breathing and Patient connected to nasal cannula oxygen  Post-op Assessment: Report given to RN  Post vital signs: stable  Last Vitals:  Vitals Value Taken Time  BP 94/66 06/05/20 1306  Temp    Pulse 65 06/05/20 1306  Resp 20 06/05/20 1306  SpO2 100 % 06/05/20 1306  Vitals shown include unvalidated device data.  Last Pain:  Vitals:   06/05/20 0912  TempSrc: Oral  PainSc:          Complications: No complications documented.

## 2020-06-05 NOTE — Op Note (Signed)
Preoperative diagnosis: Right breast intraductal papilloma.  Postoperative diagnosis: Right breast intraductal papilloma.   Procedure: Right radiofrequency tag-localized excisional biopsy                       Anesthesia: GETA  Surgeon: Dr. Windell Moment  Wound Classification: Clean  Indications: Patient is a 54 y.o. female with a right nipple discharge and a nonpalpable right breast mass noted on mammography with core biopsy demonstrating intraductal papilloma requires radiofrequency tag-localized excisional biopsy to rule out malignancy.   Findings: 1. Specimen mammography shows marker and tag on specimen 2. No other palpable mass or lymph node identified.   Description of procedure: Preoperative radiofrequency tag localization was performed by radiology. Localization studies were reviewed. The patient was taken to the operating room and placed supine on the operating table, and after general anesthesia the right chest was prepped and draped in the usual sterile fashion. A time-out was completed verifying correct patient, procedure, site, positioning, and implant(s) and/or special equipment prior to beginning this procedure.  By comparing the localization studies and interrogation with Localizer device, the probable trajectory and location of the mass was visualized. A circumareolar skin incision was made around the areola. The skin incision was made. Flaps were raised and the location of the tag was confirmed with Localizer device confirmed. A 2-0 silk figure-of-eight stay suture was placed and used for retraction. Dissection was then taken down circumferentially, taking care to include the entire localizing tag and a wide margin of grossly normal tissue. The specimen and entire localizing tag were removed. The specimen was oriented and sent to radiology with the localization studies. Confirmation was received that the entire target lesion had been resected. The wound was irrigated. Hemostasis  was checked. The wound was closed with interrupted sutures of 3-0 Vicryl and a subcuticular suture of Monocryl 3-0. No attempt was made to close the dead space.   Specimen: Right Breast excisional biopsy                     Complications: None  Estimated Blood Loss: 5 mL

## 2020-06-06 ENCOUNTER — Encounter: Payer: Self-pay | Admitting: General Surgery

## 2020-06-07 LAB — SURGICAL PATHOLOGY

## 2020-09-25 HISTORY — PX: COLONOSCOPY: SHX174

## 2020-11-25 ENCOUNTER — Ambulatory Visit
Admission: RE | Admit: 2020-11-25 | Discharge: 2020-11-25 | Disposition: A | Discharge status: Home / Self Care | Payer: BC Managed Care – PPO | Source: Ambulatory Visit | Attending: Infectious Diseases | Admitting: Infectious Diseases

## 2020-11-25 ENCOUNTER — Other Ambulatory Visit: Payer: Self-pay

## 2020-11-25 DIAGNOSIS — R928 Other abnormal and inconclusive findings on diagnostic imaging of breast: Secondary | ICD-10-CM | POA: Insufficient documentation

## 2020-11-25 DIAGNOSIS — N6320 Unspecified lump in the left breast, unspecified quadrant: Secondary | ICD-10-CM | POA: Insufficient documentation

## 2020-11-26 ENCOUNTER — Other Ambulatory Visit: Payer: Self-pay | Admitting: Infectious Diseases

## 2020-11-26 DIAGNOSIS — Z1231 Encounter for screening mammogram for malignant neoplasm of breast: Secondary | ICD-10-CM

## 2020-11-26 DIAGNOSIS — N6320 Unspecified lump in the left breast, unspecified quadrant: Secondary | ICD-10-CM

## 2020-11-26 DIAGNOSIS — R928 Other abnormal and inconclusive findings on diagnostic imaging of breast: Secondary | ICD-10-CM

## 2022-02-16 ENCOUNTER — Other Ambulatory Visit: Payer: Self-pay | Admitting: Infectious Diseases

## 2022-02-16 DIAGNOSIS — Z1231 Encounter for screening mammogram for malignant neoplasm of breast: Secondary | ICD-10-CM

## 2022-02-16 DIAGNOSIS — R928 Other abnormal and inconclusive findings on diagnostic imaging of breast: Secondary | ICD-10-CM

## 2022-02-16 DIAGNOSIS — N6320 Unspecified lump in the left breast, unspecified quadrant: Secondary | ICD-10-CM

## 2022-02-25 ENCOUNTER — Other Ambulatory Visit: Payer: Self-pay

## 2022-02-25 ENCOUNTER — Ambulatory Visit
Admission: RE | Admit: 2022-02-25 | Discharge: 2022-02-25 | Disposition: A | Discharge status: Home / Self Care | Payer: BC Managed Care – PPO | Source: Ambulatory Visit | Attending: Infectious Diseases | Admitting: Infectious Diseases

## 2022-02-25 DIAGNOSIS — Z1231 Encounter for screening mammogram for malignant neoplasm of breast: Secondary | ICD-10-CM | POA: Insufficient documentation

## 2022-02-25 DIAGNOSIS — R928 Other abnormal and inconclusive findings on diagnostic imaging of breast: Secondary | ICD-10-CM

## 2022-02-25 DIAGNOSIS — N6320 Unspecified lump in the left breast, unspecified quadrant: Secondary | ICD-10-CM | POA: Insufficient documentation

## 2022-03-03 ENCOUNTER — Other Ambulatory Visit: Payer: Self-pay | Admitting: Infectious Diseases

## 2022-03-03 DIAGNOSIS — R921 Mammographic calcification found on diagnostic imaging of breast: Secondary | ICD-10-CM

## 2022-03-03 DIAGNOSIS — N63 Unspecified lump in unspecified breast: Secondary | ICD-10-CM

## 2022-03-03 DIAGNOSIS — R928 Other abnormal and inconclusive findings on diagnostic imaging of breast: Secondary | ICD-10-CM

## 2022-03-09 ENCOUNTER — Ambulatory Visit
Admission: RE | Admit: 2022-03-09 | Discharge: 2022-03-09 | Disposition: A | Discharge status: Home / Self Care | Payer: BC Managed Care – PPO | Source: Ambulatory Visit | Attending: Infectious Diseases | Admitting: Infectious Diseases

## 2022-03-09 ENCOUNTER — Other Ambulatory Visit: Payer: Self-pay

## 2022-03-09 DIAGNOSIS — N63 Unspecified lump in unspecified breast: Secondary | ICD-10-CM | POA: Diagnosis present

## 2022-03-09 DIAGNOSIS — R921 Mammographic calcification found on diagnostic imaging of breast: Secondary | ICD-10-CM | POA: Insufficient documentation

## 2022-03-09 DIAGNOSIS — R928 Other abnormal and inconclusive findings on diagnostic imaging of breast: Secondary | ICD-10-CM | POA: Insufficient documentation

## 2022-03-09 HISTORY — PX: OTHER SURGICAL HISTORY: SHX169

## 2022-03-09 HISTORY — PX: BREAST BIOPSY: SHX20

## 2022-03-10 DIAGNOSIS — D0511 Intraductal carcinoma in situ of right breast: Secondary | ICD-10-CM

## 2022-03-10 LAB — SURGICAL PATHOLOGY

## 2022-03-11 NOTE — Progress Notes (Signed)
Received notification from Stacie Acres with [facility] that patient has received biopsy results of DCIS. Navigation initiated. Scheduled consults with Dr. Windell Moment and Dr. Tasia Catchings. Appointment information given to patient. ?

## 2022-03-12 ENCOUNTER — Other Ambulatory Visit: Payer: Self-pay

## 2022-03-12 DIAGNOSIS — D0511 Intraductal carcinoma in situ of right breast: Secondary | ICD-10-CM

## 2022-03-12 DIAGNOSIS — N6452 Nipple discharge: Secondary | ICD-10-CM

## 2022-03-19 ENCOUNTER — Encounter: Payer: Self-pay | Admitting: *Deleted

## 2022-03-20 ENCOUNTER — Ambulatory Visit
Admission: RE | Admit: 2022-03-20 | Discharge: 2022-03-20 | Disposition: A | Discharge status: Home / Self Care | Payer: BC Managed Care – PPO | Source: Ambulatory Visit | Attending: General Surgery | Admitting: General Surgery

## 2022-03-20 DIAGNOSIS — N6452 Nipple discharge: Secondary | ICD-10-CM | POA: Insufficient documentation

## 2022-03-20 DIAGNOSIS — D0511 Intraductal carcinoma in situ of right breast: Secondary | ICD-10-CM | POA: Diagnosis present

## 2022-03-20 MED ORDER — GADOBUTROL 1 MMOL/ML IV SOLN
6.0000 mL | Freq: Once | INTRAVENOUS | Status: AC | PRN
  Administered 2022-03-20: 6 mL via INTRAVENOUS

## 2022-03-23 ENCOUNTER — Other Ambulatory Visit: Payer: Self-pay | Admitting: General Surgery

## 2022-03-23 DIAGNOSIS — R599 Enlarged lymph nodes, unspecified: Secondary | ICD-10-CM

## 2022-03-23 DIAGNOSIS — R928 Other abnormal and inconclusive findings on diagnostic imaging of breast: Secondary | ICD-10-CM

## 2022-03-24 ENCOUNTER — Other Ambulatory Visit: Payer: Self-pay | Admitting: General Surgery

## 2022-03-24 DIAGNOSIS — R9389 Abnormal findings on diagnostic imaging of other specified body structures: Secondary | ICD-10-CM

## 2022-03-25 ENCOUNTER — Encounter: Payer: Self-pay | Admitting: Oncology

## 2022-03-25 ENCOUNTER — Other Ambulatory Visit: Payer: Self-pay

## 2022-03-25 ENCOUNTER — Inpatient Hospital Stay: Payer: BC Managed Care – PPO

## 2022-03-25 ENCOUNTER — Inpatient Hospital Stay: Payer: BC Managed Care – PPO | Attending: Oncology | Admitting: Oncology

## 2022-03-25 VITALS — BP 117/86 | HR 83 | Temp 96.9°F | Ht 64.0 in | Wt 125.0 lb

## 2022-03-25 DIAGNOSIS — Z8249 Family history of ischemic heart disease and other diseases of the circulatory system: Secondary | ICD-10-CM | POA: Diagnosis not present

## 2022-03-25 DIAGNOSIS — Z9229 Personal history of other drug therapy: Secondary | ICD-10-CM | POA: Diagnosis not present

## 2022-03-25 DIAGNOSIS — Z79890 Hormone replacement therapy (postmenopausal): Secondary | ICD-10-CM | POA: Diagnosis not present

## 2022-03-25 DIAGNOSIS — Z8349 Family history of other endocrine, nutritional and metabolic diseases: Secondary | ICD-10-CM | POA: Insufficient documentation

## 2022-03-25 DIAGNOSIS — D0511 Intraductal carcinoma in situ of right breast: Secondary | ICD-10-CM | POA: Diagnosis present

## 2022-03-25 DIAGNOSIS — D0510 Intraductal carcinoma in situ of unspecified breast: Secondary | ICD-10-CM

## 2022-03-25 DIAGNOSIS — Z8042 Family history of malignant neoplasm of prostate: Secondary | ICD-10-CM | POA: Insufficient documentation

## 2022-03-25 DIAGNOSIS — Z7189 Other specified counseling: Secondary | ICD-10-CM | POA: Insufficient documentation

## 2022-03-25 DIAGNOSIS — Z833 Family history of diabetes mellitus: Secondary | ICD-10-CM | POA: Diagnosis not present

## 2022-03-25 DIAGNOSIS — Z79899 Other long term (current) drug therapy: Secondary | ICD-10-CM | POA: Diagnosis not present

## 2022-03-25 DIAGNOSIS — Z841 Family history of disorders of kidney and ureter: Secondary | ICD-10-CM | POA: Diagnosis not present

## 2022-03-25 LAB — CBC WITH DIFFERENTIAL/PLATELET
Abs Immature Granulocytes: 0.02 10*3/uL (ref 0.00–0.07)
Basophils Absolute: 0.1 10*3/uL (ref 0.0–0.1)
Basophils Relative: 1 %
Eosinophils Absolute: 0.2 10*3/uL (ref 0.0–0.5)
Eosinophils Relative: 3 %
HCT: 40.8 % (ref 36.0–46.0)
Hemoglobin: 12.9 g/dL (ref 12.0–15.0)
Immature Granulocytes: 0 %
Lymphocytes Relative: 33 %
Lymphs Abs: 1.9 10*3/uL (ref 0.7–4.0)
MCH: 28.5 pg (ref 26.0–34.0)
MCHC: 31.6 g/dL (ref 30.0–36.0)
MCV: 90.1 fL (ref 80.0–100.0)
Monocytes Absolute: 0.5 10*3/uL (ref 0.1–1.0)
Monocytes Relative: 9 %
Neutro Abs: 3.1 10*3/uL (ref 1.7–7.7)
Neutrophils Relative %: 54 %
Platelets: 213 10*3/uL (ref 150–400)
RBC: 4.53 MIL/uL (ref 3.87–5.11)
RDW: 13 % (ref 11.5–15.5)
WBC: 5.7 10*3/uL (ref 4.0–10.5)
nRBC: 0 % (ref 0.0–0.2)

## 2022-03-25 LAB — COMPREHENSIVE METABOLIC PANEL
ALT: 22 U/L (ref 0–44)
AST: 25 U/L (ref 15–41)
Albumin: 4 g/dL (ref 3.5–5.0)
Alkaline Phosphatase: 41 U/L (ref 38–126)
Anion gap: 4 — ABNORMAL LOW (ref 5–15)
BUN: 18 mg/dL (ref 6–20)
CO2: 26 mmol/L (ref 22–32)
Calcium: 9.2 mg/dL (ref 8.9–10.3)
Chloride: 105 mmol/L (ref 98–111)
Creatinine, Ser: 0.86 mg/dL (ref 0.44–1.00)
GFR, Estimated: >60 mL/min (ref >60)
Glucose, Bld: 93 mg/dL (ref 70–99)
Potassium: 4 mmol/L (ref 3.5–5.1)
Sodium: 135 mmol/L (ref 135–145)
Total Bilirubin: 1.3 mg/dL — ABNORMAL HIGH (ref 0.3–1.2)
Total Protein: 7.5 g/dL (ref 6.5–8.1)

## 2022-03-25 MED ORDER — ALPRAZOLAM 0.5 MG PO TABS: 0.5000 mg | ORAL_TABLET | ORAL | 0 refills | Status: DC

## 2022-03-25 NOTE — Progress Notes (Signed)
?Hematology/Oncology Consult note ?[phone] [phone] ?  ? ?   ? ? ?Patient Care Team: ?Leonel Ramsay, MD as PCP - General (Infectious Diseases) ?Theodore Demark, RN as Oncology Nurse Navigator ?Herbert Pun, MD as Consulting Physician (General Surgery) ?Earlie Server, MD as Consulting Physician (Oncology) ? ?REFERRING PROVIDER: ?Leonel Ramsay, MD  ?CHIEF COMPLAINTS/REASON FOR VISIT:  ?Evaluation of DCIS ? ?HISTORY OF PRESENTING ILLNESS:  ? ?Jacqueline Murray is a  56 y.o.  female with PMH listed below was seen in consultation at the request of  Leonel Ramsay, MD  for evaluation of DCIS ? ?Patient has a history of papilloma in 2021. ?04/29/2020, bilateral breast MRI showed suspicious clumped non-mass enhancement at 12:00 in the right breast, and an indeterminate suspicious mass at the 9:00 in the right breast. ?05/17/2020, 9:00 breast mass positive for papilloma with usual ductal hyperplasia.  12:00 mass was benign. ?06/05/2020, right breast excisional biopsy showed benign breast tissue with intraductal papilloma, incidental fibroadenoma, fibrocystic changes, usual ductal hyperplasia, prior biopsy changes.  Negative for atypia and malignancy.  Right breast ductal excisional biopsy was negative for atypia and malignancy. ? ?Patient complains of history of bloody right nipple discharge.  History of papilloma excised in 2021.  She also has a left breast mass was originally seen on 02/12/2020 study and being watched. ?02/25/2022, bilateral breast mammogram showed ?Suspicious calcifications in the upper outer quadrant of the right breast; intra ductal mass in the 3:00 region of the right breast 3 cm from nipple.;  Stable benign-appearing mass in the 3: 30 region of the left breast.  Sonographic evaluation of the right axilla does not show any enlarged adenopathy. ? ?03/09/2022, patient underwent right breast 3:00 3 cm from the nipple, ultrasound-guided biopsy, pathology showed  fragment of intraductal papilloma, with associated usual ductal hyperplasia.  Background mammary parenchyma with fibrocystic changes negative for atypical proliferative breast disease.;  ?Right upper outer quadrant calcification stereotactic core needle biopsy showed DCIS, high-grade with comedonecrosis.  Calcifications associated with DCIS.  Negative for invasive mammary carcinoma. ? ?Patient has been seen by Dr. Peyton Najjar.  Recommended bilateral MRI for further evaluation of the extent of DCIS ?03/20/2022, bilateral breast MRI ?1. Large area of non mass enhancement within the superior central right breast adjacent to the marking clip, potentially representing additional DCIS adjacent to the recent site of biopsy. ?2. Multiple enhancing masses are demonstrated throughout the right and left breast. These are indeterminate in etiology however may represent a background of papillomatosis. Additional etiologies not excluded. ?3. Cortically thickened right axillary lymph node, indeterminate. ? ?Patient was referred to establish care with oncology for further evaluation management.  Patient has no new complaints today.  Some soreness at the site of previous biopsy. ?Family history is positive for father with prostate cancer.  No family history of breast cancer. ? ?Menarche 56 years of age ?Age at first birth 73, she has 1 daughter. ?Patient has taken OCP 20+ years. ?History of hysterectomy. ?+ Hormone replacement therapy since 2016 /2017, recently stopped ?Previous right breast excisional biopsy-papilloma ? ? ?Review of Systems  ?Constitutional:  Negative for appetite change, chills, fatigue and fever.  ?HENT:   Negative for hearing loss and voice change.   ?Eyes:  Negative for eye problems.  ?Respiratory:  Negative for chest tightness and cough.   ?Cardiovascular:  Negative for chest pain.  ?Gastrointestinal:  Negative for abdominal distention, abdominal pain and blood in stool.  ?Endocrine: Negative for hot flashes.   ?Genitourinary:  Negative for difficulty urinating and frequency.   ?Musculoskeletal:  Negative for arthralgias.  ?Skin:  Negative for itching and rash.  ?Neurological:  Negative for extremity weakness.  ?Hematological:  Negative for adenopathy.  ?Psychiatric/Behavioral:  Negative for confusion.   ? ?MEDICAL HISTORY:  ?Past Medical History:  ?Diagnosis Date  ? Anemia   ? Colon polyp 04/19/2020  ? Complication of anesthesia   ? Ductal carcinoma in situ (DCIS) of right breast   ? GERD (gastroesophageal reflux disease)   ? HLD (hyperlipidemia)   ? Hypertension   ? IBS (irritable bowel syndrome)   ? Intraductal papilloma of breast 01/21/2012  ? left breast  ? Intraductal papilloma of breast, right   ? PONV (postoperative nausea and vomiting)   ? EXTREME NAUSEA  ? ? ?SURGICAL HISTORY: ?Past Surgical History:  ?Procedure Laterality Date  ? ABDOMINAL HYSTERECTOMY    ? BREAST BIOPSY Bilateral few yrs ago  ? benign-cyst  ? BREAST BIOPSY Right 1986  ? fibroadenoma  ? BREAST BIOPSY Right 05/17/2020  ? neg/ MRI bx 2 areas  ? BREAST BIOPSY Right 03/09/2022  ? rt breast stereo calcs x clip path pending  ? BREAST BIOPSY WITH RADIO FREQUENCY LOCALIZER Right 06/05/2020  ? Procedure: BREAST BIOPSY WITH RADIO FREQUENCY LOCALIZER;  Surgeon: Herbert Pun, MD;  Location: [facility] ORS;  Service: General;  Laterality: Right;  ? breast biposy Right 03/09/2022  ? u/s core bc heart clip path pending  ? BREAST EXCISIONAL BIOPSY Right 05/26/2020  ? neg  ? BREAST EXCISIONAL BIOPSY Left 2013  ? neg  ? CESAREAN SECTION    ? COLONOSCOPY    ? ? ?SOCIAL HISTORY: ?Social History  ? ?Socioeconomic History  ? Marital status: Divorced  ?  Spouse name: Not on file  ? Number of children: Not on file  ? Years of education: Not on file  ? Highest education level: Not on file  ?Occupational History  ? Not on file  ?Tobacco Use  ? Smoking status: Never  ?  Passive exposure: Never  ? Smokeless tobacco: Never  ?Vaping Use  ? Vaping Use: Never used   ?Substance and Sexual Activity  ? Alcohol use: Not Currently  ? Drug use: Never  ? Sexual activity: Not Currently  ?Other Topics Concern  ? Not on file  ?Social History Narrative  ? Not on file  ? ?Social Determinants of Health  ? ?Financial Resource Strain: Not on file  ?Food Insecurity: Not on file  ?Transportation Needs: Not on file  ?Physical Activity: Not on file  ?Stress: Not on file  ?Social Connections: Not on file  ?Intimate Partner Violence: Not on file  ? ? ?FAMILY HISTORY: ?Family History  ?Problem Relation Age of Onset  ? Diabetes Mellitus II Mother   ? Heart disease Mother   ? Prostate cancer Father   ? Hypertension Father   ? CAD Father   ? Hypertension Sister   ? Hyperlipidemia Sister   ? CAD Brother   ? Diabetes Mellitus II Brother   ? Heart disease Brother   ? Kidney disease Brother   ? ? ?ALLERGIES:  is allergic to ace inhibitors, latex, other, povidone iodine, shellfish-derived products, and tape. ? ?MEDICATIONS:  ?Current Outpatient Medications  ?Medication Sig Dispense Refill  ? ALPRAZolam (XANAX) 0.5 MG tablet Take 1 tablet (0.5 mg total) by mouth See admin instructions. Take 1 tablet 30 minutes prior to biopsy procedure. You may repeat another dose after 30 minutes. 2 tablet 0  ? Calcium  Carbonate (CALTRATE 600 PO) Take 600 mg by mouth daily.    ? estradiol (ESTRACE) 1 MG tablet Take 1 mg by mouth at bedtime.    ? Loratadine 10 MG CAPS Take 10 mg by mouth at bedtime.    ? montelukast (SINGULAIR) 10 MG tablet Take 10 mg by mouth at bedtime.    ? Multiple Vitamins-Minerals (MULTIVITAMIN ADULT, MINERALS,) TABS Take 1 tablet by mouth at bedtime.    ? omeprazole (PRILOSEC) 20 MG capsule Take 20 mg by mouth daily as needed (Heartburn).    ? ondansetron (ZOFRAN-ODT) 4 MG disintegrating tablet Take 1 tablet (4 mg total) by mouth every 8 (eight) hours as needed for nausea or vomiting. 10 tablet 0  ? simvastatin (ZOCOR) 20 MG tablet Take 20 mg by mouth at bedtime.    ? spironolactone (ALDACTONE) 100  MG tablet Take 100 mg by mouth daily.    ? triamcinolone (NASACORT) 55 MCG/ACT AERO nasal inhaler Place 2 sprays into the nose daily.     ? ?No current facility-administered medications for this visit.  ? ? ? ?PHYSICAL EX

## 2022-03-26 ENCOUNTER — Inpatient Hospital Stay: Payer: BC Managed Care – PPO | Admitting: Licensed Clinical Social Worker

## 2022-03-26 ENCOUNTER — Inpatient Hospital Stay: Payer: BC Managed Care – PPO

## 2022-03-26 ENCOUNTER — Encounter: Payer: Self-pay | Admitting: Licensed Clinical Social Worker

## 2022-03-26 DIAGNOSIS — D0511 Intraductal carcinoma in situ of right breast: Secondary | ICD-10-CM

## 2022-03-26 DIAGNOSIS — Z8042 Family history of malignant neoplasm of prostate: Secondary | ICD-10-CM

## 2022-03-26 NOTE — Progress Notes (Signed)
REFERRING PROVIDER: ?Earlie Server, MD ?[place],  [place] [address] ? ?PRIMARY PROVIDER:  ?Leonel Ramsay, MD ? ?PRIMARY REASON FOR VISIT:  ?1. Ductal carcinoma in situ (DCIS) of right breast   ?2. Family history of prostate cancer   ? ? ? ?HISTORY OF PRESENT ILLNESS:   ?Jacqueline Murray, a 56 y.o. female, was seen for a [facility] cancer genetics consultation at the request of Dr. Tasia Catchings due to a personal and family history of cancer.  Jacqueline Murray presents to clinic today to discuss the possibility of a hereditary predisposition to cancer, genetic testing, and to further clarify her future cancer risks, as well as potential cancer risks for family members.  ? ?In 2023, at the age of 25, Jacqueline Murray was diagnosed with DCIS of the right breast. The treatment plan is still being determined. ? ?CANCER HISTORY:  ?Oncology History  ?Ductal carcinoma in situ (DCIS) of right breast  ?03/25/2022 Initial Diagnosis  ? Ductal carcinoma in situ (DCIS) of right breast ?  ?03/25/2022 Cancer Staging  ? Staging form: Breast, AJCC 8th Edition ?- Clinical: Stage 0 (cTis (DCIS), cN0, cM0, G3, ER: Unknown) - Signed by Earlie Server, MD on 03/25/2022 ?Stage prefix: Initial diagnosis ?Histologic grading system: 3 grade system ? ?  ? ? ? ?RISK FACTORS:  ?Menarche was at age 20.  ?First live birth at age 87.  ?OCP use for approximately 20+ years ?Ovaries intact: yes ?Hysterectomy: yes.  ?Menopausal status: postmenopausal.  ?HRT use: 7 years. ?Colonoscopy: yes; normal.  ?Mammogram within the last year: yes. ? ?Past Medical History:  ?Diagnosis Date  ? Anemia   ? Colon polyp 04/19/2020  ? Complication of anesthesia   ? Ductal carcinoma in situ (DCIS) of right breast   ? GERD (gastroesophageal reflux disease)   ? HLD (hyperlipidemia)   ? Hypertension   ? IBS (irritable bowel syndrome)   ? Intraductal papilloma of breast 01/21/2012  ? left breast  ? Intraductal papilloma of breast, right   ? PONV (postoperative nausea and vomiting)   ? EXTREME NAUSEA   ? ? ?Past Surgical History:  ?Procedure Laterality Date  ? ABDOMINAL HYSTERECTOMY    ? BREAST BIOPSY Bilateral few yrs ago  ? benign-cyst  ? BREAST BIOPSY Right 1986  ? fibroadenoma  ? BREAST BIOPSY Right 05/17/2020  ? neg/ MRI bx 2 areas  ? BREAST BIOPSY Right 03/09/2022  ? rt breast stereo calcs x clip path pending  ? BREAST BIOPSY WITH RADIO FREQUENCY LOCALIZER Right 06/05/2020  ? Procedure: BREAST BIOPSY WITH RADIO FREQUENCY LOCALIZER;  Surgeon: Herbert Pun, MD;  Location: [facility] ORS;  Service: General;  Laterality: Right;  ? breast biposy Right 03/09/2022  ? u/s core bc heart clip path pending  ? BREAST EXCISIONAL BIOPSY Right 05/26/2020  ? neg  ? BREAST EXCISIONAL BIOPSY Left 2013  ? neg  ? CESAREAN SECTION    ? COLONOSCOPY    ? ? ?Social History  ? ?Socioeconomic History  ? Marital status: Divorced  ?  Spouse name: Not on file  ? Number of children: Not on file  ? Years of education: Not on file  ? Highest education level: Not on file  ?Occupational History  ? Not on file  ?Tobacco Use  ? Smoking status: Never  ?  Passive exposure: Never  ? Smokeless tobacco: Never  ?Vaping Use  ? Vaping Use: Never used  ?Substance and Sexual Activity  ? Alcohol use: Not Currently  ? Drug use:  Never  ? Sexual activity: Not Currently  ?Other Topics Concern  ? Not on file  ?Social History Narrative  ? Not on file  ? ?Social Determinants of Health  ? ?Financial Resource Strain: Not on file  ?Food Insecurity: Not on file  ?Transportation Needs: Not on file  ?Physical Activity: Not on file  ?Stress: Not on file  ?Social Connections: Not on file  ?  ? ?FAMILY HISTORY:  ?We obtained a detailed, 4-generation family history.  Significant diagnoses are listed below: ?Family History  ?Problem Relation Age of Onset  ? Diabetes Mellitus II Mother   ? Heart disease Mother   ? Prostate cancer Father   ? Hypertension Father   ? CAD Father   ? Hypertension Sister   ? Hyperlipidemia Sister   ? CAD Brother   ? Diabetes Mellitus II  Brother   ? Heart disease Brother   ? Kidney disease Brother   ? ?Jacqueline Murray has 1 daughter, 8. She had 3 full brothers, 1 full sister, and 1 maternal half brother. No cancers. ? ?Jacqueline Murray mother died at 27. No known cancers on this side of the family. ? ?Jacqueline Murray father had prostate cancer at 58 that was metastatic, he passed at 41. Patient had 2 paternal aunts and 1 uncle. One aunt did have cancer, unknown type, passed in her 6s. Paternal grandmother died in her 11s. No information about paternal grandfather. ? ?Jacqueline Murray is unaware of previous family history of genetic testing for hereditary cancer risks. There is no reported Ashkenazi Jewish ancestry. There is no known consanguinity ? ? ? ?GENETIC COUNSELING ASSESSMENT: Jacqueline Murray is a 56 y.o. female with a personal and family history of cancer which is somewhat suggestive of a hereditary cancer syndrome and predisposition to cancer. We, therefore, discussed and recommended the following at today's visit.  ? ?DISCUSSION: We discussed that approximately 10% of breast cancer is hereditary. Most cases of hereditary breast cancer are associated with BRCA1/BRCA2 genes, although there are other genes associated with hereditary cancer as well. Cancers and risks are gene specific.  We discussed that testing is beneficial for several reasons including surgical decision-making for breast cancer, knowing about other cancer risks, identifying potential screening and risk-reduction options that may be appropriate, and to understand if other family members could be at risk for cancer and allow them to undergo genetic testing.  ? ?We reviewed the characteristics, features and inheritance patterns of hereditary cancer syndromes. We also discussed genetic testing, including the appropriate family members to test, the process of testing, insurance coverage and turn-around-time for results. We discussed the implications of a negative, positive and/or variant of uncertain  significant result. In order to get genetic test results in a timely manner so that Jacqueline Murray can use these genetic test results for surgical decisions, we recommended Jacqueline Murray pursue genetic testing for the [facility] panel. Once complete, we recommend Jacqueline Murray pursue reflex genetic testing to the CustomNext+RNA panel. ? ?Based on Jacqueline Murray personal and family history of cancer, she meets medical criteria for genetic testing. Despite that she meets criteria, she may still have an out of pocket cost.    ? ?PLAN: After considering the risks, benefits, and limitations, Jacqueline Murray provided informed consent to pursue genetic testing and the blood sample was sent to [facility] for analysis of the [facility]+CustomNext+RNA panel. Initial results should be available within approximately 5-12 days' time, at which point they will be disclosed by telephone to Jacqueline Murray, as  will any additional recommendations warranted by these results. Jacqueline Murray will receive a summary of her genetic counseling visit and a copy of her results once available. This information will also be available in Epic.  ? ?Jacqueline Murray's questions were answered to her satisfaction today. Our contact information was provided should additional questions or concerns arise. Thank you for the referral and allowing [place] to share in the care of your patient.  ? ?Faith Rogue, MS, LCGC ?Genetic Counselor ?[email or web address] ?Phone: [phone] ? ?The patient was seen for a total of 25 minutes in face-to-face genetic counseling.  Dr. Grayland Ormond was available for discussion regarding this case.  ? ?_______________________________________________________________________ ?For Office Staff:  ?Number of people involved in session: 1 ?Was an Intern/ student involved with case: no ? ?

## 2022-04-06 ENCOUNTER — Telehealth: Payer: Self-pay | Admitting: Licensed Clinical Social Worker

## 2022-04-06 ENCOUNTER — Encounter: Payer: Self-pay | Admitting: Licensed Clinical Social Worker

## 2022-04-06 ENCOUNTER — Ambulatory Visit: Payer: Self-pay | Admitting: Licensed Clinical Social Worker

## 2022-04-06 DIAGNOSIS — Z1379 Encounter for other screening for genetic and chromosomal anomalies: Secondary | ICD-10-CM

## 2022-04-06 NOTE — Telephone Encounter (Signed)
Revealed negative genetic testing.  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 This normal result is reassuring and indicates that it is unlikely Jacqueline Murray's cancer is due to a hereditary cause.  It is unlikely that there is an increased risk of another cancer due to a mutation in one of these genes.  However, genetic testing is not perfect, and cannot definitively rule out a hereditary cause.  It will be important for her to keep in contact with genetics to learn if any additional testing may be needed in the future.    ? ?

## 2022-04-06 NOTE — Progress Notes (Signed)
HPI:  Jacqueline Murray was previously seen in the [facility] clinic due to a personal and family history of cancer and concerns regarding a hereditary predisposition to cancer. Please refer to our prior cancer genetics clinic note for more information regarding our discussion, assessment and recommendations, at the time. Jacqueline Murray recent genetic test results were disclosed to her, as were recommendations warranted by these results. These results and recommendations are discussed in more detail below. ? ?CANCER HISTORY:  ?Oncology History  ?Ductal carcinoma in situ (DCIS) of right breast  ?03/25/2022 Initial Diagnosis  ? Ductal carcinoma in situ (DCIS) of right breast ? ?  ?03/25/2022 Cancer Staging  ? Staging form: Breast, AJCC 8th Edition ?- Clinical: Stage 0 (cTis (DCIS), cN0, cM0, G3, ER: Unknown) - Signed by Earlie Server, MD on 03/25/2022 ?Stage prefix: Initial diagnosis ?Histologic grading system: 3 grade system ? ?  ? Genetic Testing  ? Negative genetic testing. No pathogenic variants identified on the [facility] CustomNext+RNA panel. 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 The report date is 04/03/2022. ? ?The CustomNext-Cancer+RNAinsight panel offered by [facility] includes sequencing and rearrangement analysis for the following 47 genes:  APC, ATM, AXIN2, BARD1, BMPR1A, BRCA1, BRCA2, BRIP1, CDH1, CDK4, CDKN2A, CHEK2, DICER1, EPCAM, GREM1, HOXB13, MEN1, MLH1, MSH2, MSH3, MSH6, MUTYH, NBN, NF1, NF2, NTHL1, PALB2, PMS2, POLD1, POLE, PTEN, RAD51C, RAD51D, RECQL, RET, SDHA, SDHAF2, SDHB, SDHC, SDHD, SMAD4, SMARCA4, STK11, TP53, TSC1, TSC2, and VHL.  RNA data is routinely analyzed for use in variant interpretation for all genes. ? ?  ? ? ?FAMILY HISTORY:  ?We obtained a detailed, 4-generation family history.  Significant diagnoses are listed below: ?Family History  ?Problem Relation Age of Onset  ? Diabetes Mellitus II Mother   ? Heart disease Mother   ? Prostate cancer Father 5  ?     metastatic  ?  Hypertension Father   ? CAD Father   ? Hypertension Sister   ? Hyperlipidemia Sister   ? CAD Brother   ? Diabetes Mellitus II Brother   ? Heart disease Brother   ? Kidney disease Brother   ? ?Jacqueline Murray has 1 daughter, 60. She had 3 full brothers, 1 full sister, and 1 maternal half brother. No cancers. ?  ?Jacqueline Murray's mother died at 59. No known cancers on this side of the family. ?  ?Jacqueline Murray's father had prostate cancer at 18 that was metastatic, he passed at 69. Patient had 2 paternal aunts and 1 uncle. One aunt did have cancer, unknown type, passed in her 34s. Paternal grandmother died in her 26s. No information about paternal grandfather. ?  ?Jacqueline Murray is unaware of previous family history of genetic testing for hereditary cancer risks. There is no reported Ashkenazi Jewish ancestry. There is no known consanguinity ?  ? ? ?GENETIC TEST RESULTS: Genetic testing reported out on 04/03/2022 through the [facility] BRCAPlus+CustomNext+RNA cancer panel found no pathogenic mutations.  ? ?The CustomNext-Cancer+RNAinsight panel offered by [facility] includes sequencing and rearrangement analysis for the following 47 genes:  APC, ATM, AXIN2, BARD1, BMPR1A, BRCA1, BRCA2, BRIP1, CDH1, CDK4, CDKN2A, CHEK2, DICER1, EPCAM, GREM1, HOXB13, MEN1, MLH1, MSH2, MSH3, MSH6, MUTYH, NBN, NF1, NF2, NTHL1, PALB2, PMS2, POLD1, POLE, PTEN, RAD51C, RAD51D, RECQL, RET, SDHA, SDHAF2, SDHB, SDHC, SDHD, SMAD4, SMARCA4, STK11, TP53, TSC1, TSC2, and VHL.  RNA data is routinely analyzed for use in variant interpretation for all genes.  ? ?The test report has been scanned into EPIC and is located under the Molecular  Pathology section of the Results Review tab.  A portion of the result report is included below for reference.  ? ? ? ?We discussed that because current genetic testing is not perfect, it is possible there may be a gene mutation in one of these genes that current testing cannot detect, but that chance is small.  There could be another gene  that has not yet been discovered, or that we have not yet tested, that is responsible for the cancer diagnoses in the family. It is also possible there is a hereditary cause for the cancer in the family that Jacqueline Murray did not inherit and therefore was not identified in her testing.  Therefore, it is important to remain in touch with cancer genetics in the future so that we can continue to offer Jacqueline Murray the most up to date genetic testing.  ? 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  At this time, it is unknown if this variant is associated with increased cancer risk or if this is a normal finding, but most variants such as this get reclassified to being inconsequential. It should not be used to make medical management decisions. With time, we suspect the lab will determine the significance of this variant, if any. If we do learn more about it we will try to contact Jacqueline Murray to discuss it further. However, it is important to stay in touch with [place] periodically and keep the address and phone number up to date. ? ?ADDITIONAL GENETIC TESTING: We discussed with Jacqueline Murray that her genetic testing was fairly extensive.  If there are genes identified to increase cancer risk that can be analyzed in the future, we would be happy to discuss and coordinate this testing at that time.   ? ?CANCER SCREENING RECOMMENDATIONS: Jacqueline Murray test result is considered negative (normal).  This means that we have not identified a hereditary cause for her  personal and family history of cancer at this time. Most cancers happen by chance and this negative test suggests that her cancer may fall into this category.   ? ?While reassuring, this does not definitively rule out a hereditary predisposition to cancer. It is still possible that there could be genetic mutations that are undetectable by current technology. There could be genetic mutations in genes that have not been  tested or identified to increase cancer risk.  Therefore, it is recommended she continue to follow the cancer management and screening guidelines provided by her oncology and primary healthcare provider.  ? ?An individual's cancer risk and medical management are not determined by genetic test results alone. Overall cancer risk assessment incorporates additional factors, including personal medical history, family history, and any available genetic information that may result in a personalized plan for cancer prevention and surveillance. ? ?RECOMMENDATIONS FOR FAMILY MEMBERS:  Relatives in this family might be at some increased risk of developing cancer, over the general population risk, simply due to the family history of cancer.  We recommended female relatives in this family have a yearly mammogram beginning at age 16, or 60 years younger than the earliest onset of cancer, an annual clinical breast exam, and perform monthly breast self-exams. Female relatives in this family should also have a gynecological exam as recommended by their primary provider.  All family members should be referred for colonoscopy starting at age 57.  ? ?FOLLOW-UP: Lastly, we discussed with Jacqueline Murray that cancer genetics is a rapidly advancing field  and it is possible that new genetic tests will be appropriate for her and/or her family members in the future. We encouraged her to remain in contact with cancer genetics on an annual basis so we can update her personal and family histories and let her know of advances in cancer genetics that may benefit this family.  ? ?Our contact number was provided. Jacqueline Murray questions were answered to her satisfaction, and she knows she is welcome to call us at anytime with additional questions or concerns.  ? ?Jacqueline Rogue, MS, LCGC ?Genetic Counselor ?[email or web address] ?Phone: [phone] ? ?

## 2022-04-09 ENCOUNTER — Ambulatory Visit
Admission: RE | Admit: 2022-04-09 | Discharge: 2022-04-09 | Disposition: A | Discharge status: Home / Self Care | Payer: BC Managed Care – PPO | Source: Ambulatory Visit | Attending: General Surgery | Admitting: General Surgery

## 2022-04-09 DIAGNOSIS — R9389 Abnormal findings on diagnostic imaging of other specified body structures: Secondary | ICD-10-CM

## 2022-04-09 MED ORDER — GADOBUTROL 1 MMOL/ML IV SOLN
6.0000 mL | Freq: Once | INTRAVENOUS | Status: AC | PRN
  Administered 2022-04-09: 6 mL via INTRAVENOUS

## 2022-04-12 IMAGING — MR MR BREAST BILAT WO/W CM
2 of 9 series · 7 of 48 positions shown · IV contrast (gadavist)
Comparison: Previous mammography

CLINICAL DATA: Right reddish brown spontaneous nipple discharge.
Probably benign left breast mass at 3 o'clock.

LABS:  None
EXAM:
BILATERAL BREAST MRI WITH AND WITHOUT CONTRAST
TECHNIQUE: Multiplanar, multisequence MR images of both breasts were obtained
prior to and following the intravenous administration of 5 ml of
Gadavist

[Series 3: T2 · axial · B · 3.0mm · 0.97mm/px · 1 of 46 slices shown]
[im 1/46]
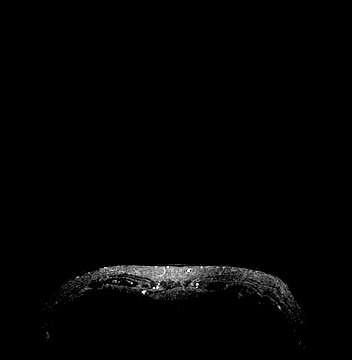

[Series 5: T1 · axial · B · 1.5mm · 0.97mm/px · z∈[-62,+105]mm · 6 of 112 slices shown]
[im 1/112]
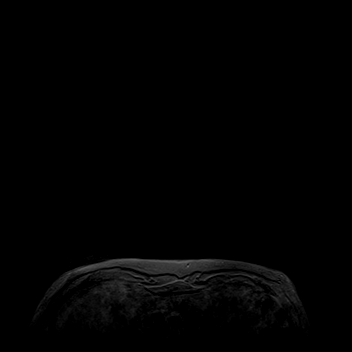
[im 23/112]
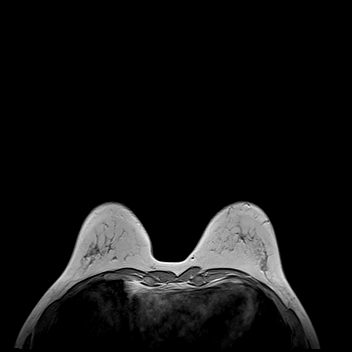
[im 45/112]
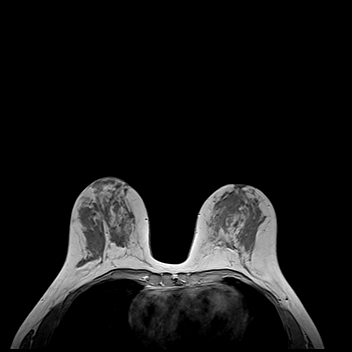
[im 67/112]
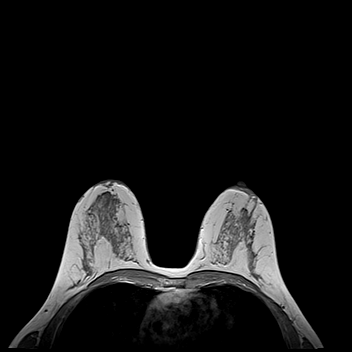
[im 89/112]
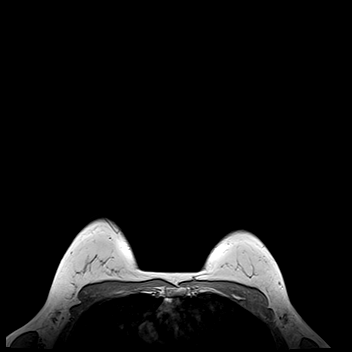
[im 112/112]
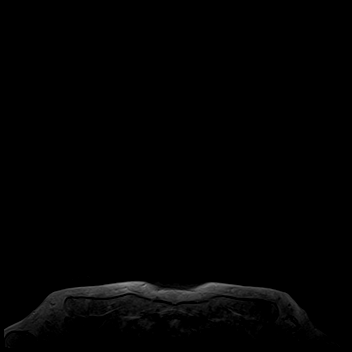

[7 of 48 positions shown; findings below may reference images not displayed]

Three-dimensional MR images were rendered by post-processing of the
original MR data on an independent workstation. The
three-dimensional MR images were interpreted, and findings are
reported in the following complete MRI report for this study. Three
dimensional images were evaluated at the independent [employer]
workstation
FINDINGS: Breast composition: c. Heterogeneous fibroglandular tissue.

Background parenchymal enhancement: Marked

Right breast: There is significant background enhancement limiting
evaluation. There is clumped non mass enhancement at approximately
12 o'clock in the right breast measuring 17 by 11 mm on series 7,
image 66. This non mass enhancement demonstrates persistent
kinetics. There is a mass at approximately 9 o'clock in the right
breast as seen on series 7, image 62 measuring 6 mm. This mass
demonstrates decreased enhancement centrally. There are a few
dilated ducts on the right, extending into the right nipple. There
is tram track enhancement in the right nipple and just deep to the
right nipple. No other suspicious findings in the right breast.

Left breast: There is significant background enhancement on the left
with no discrete dominant mass. No discrete correlate for the
probably benign left breast mass seen with mammography. There is
mild enhancement in the left nipple, less than seen on the right.

Lymph nodes: No abnormal appearing lymph nodes.

Ancillary findings:  None.
IMPRESSION: There is suspicious clumped non mass enhancement at 12 o'clock in
the right breast and an indeterminate suspicious mass at 9 o'clock
in the right breast as described above. The tram track enhancement
in the right nipple appears to correlate with dilated milk ducts and
likely represents inflamed ducts with no discrete mass. Similar but
less pronounced enhancement is seen on the left which is somewhat
reassuring.

RECOMMENDATION:
Recommend MRI guided biopsy of the 12 o'clock and 9 o'clock right
breast masses. If the biopsies are benign, recommend surgical
consultation for central duct excision with particular attention to
the milk ducts immediately beneath the right nipple.

BI-RADS CATEGORY  4: Suspicious.

These results will be called to the ordering clinician or
representative by the Radiologist Assistant, and communication
documented in the PACS or [REDACTED].

## 2022-04-30 ENCOUNTER — Ambulatory Visit
Admission: RE | Admit: 2022-04-30 | Discharge: 2022-04-30 | Disposition: A | Discharge status: Home / Self Care | Payer: BC Managed Care – PPO | Source: Ambulatory Visit | Attending: General Surgery | Admitting: General Surgery

## 2022-04-30 DIAGNOSIS — R928 Other abnormal and inconclusive findings on diagnostic imaging of breast: Secondary | ICD-10-CM

## 2022-04-30 DIAGNOSIS — R599 Enlarged lymph nodes, unspecified: Secondary | ICD-10-CM

## 2022-05-01 ENCOUNTER — Ambulatory Visit: Payer: Self-pay | Admitting: General Surgery

## 2022-05-01 ENCOUNTER — Other Ambulatory Visit: Payer: Self-pay | Admitting: General Surgery

## 2022-05-01 DIAGNOSIS — D0511 Intraductal carcinoma in situ of right breast: Secondary | ICD-10-CM

## 2022-05-01 NOTE — H&P (Signed)
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  She had excisional biopsy that shows intraductal papilloma without atypia or malignancy.  Her nipple discharge resolved.  She endorses that she started having nipple discharge again.  She had a diagnostic mammogram that shows a mass in the right upper quadrant of the right breast and another mass at the 3 o'clock position.  This led to core biopsy of both lesions.  Core biopsy of the right upper quadrant shows DCIS, high-grade with comedonecrosis.  Core biopsy of the 3 o'clock position showed intraductal papilloma. ?  ?Due to the high-grade DCIS MRI was recommended.  Since patient wanted to consider breast conserving surgery I proceeded to order the MRI.  MRI showed nonenhancing mass around the known malignancy.  Biopsy showed fibrocystic changes with fat necrosis and usual ductal hyperplasia without atypia or malignancy Second Look with ultrasound of the lymph nodes were negative for any abnormality. ?  ?So far we have right upper outer quadrant 5 mm calcification that are consistent with DCIS, high-grade.  All other abnormalities has been negative for malignancy.  With all these findings I see that is reasonable to proceed with breast conserving surgery as patient requests. ?  ?PROBLEM LIST: ?       ?Problem List  Date Reviewed: 04/06/2022  ?        Noted  ?  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  Anemia 04/19/2020  ?  Colon polyp 04/19/2020  ?  Overview  ?    Benign ?   ?  ?  Dysfunctional uterine bleeding 04/19/2020  ?  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  IBS (irritable bowel syndrome) 04/19/2020  ?  Migraine 04/19/2020  ?  Uterine leiomyoma 04/19/2020  ?  Essential hypertension 02/11/2018  ?  Pure hypercholesterolemia 02/11/2018  ?  Hot flashes, menopausal 02/11/2018  ?  Non-seasonal allergic rhinitis 02/11/2018  ?  Gastroesophageal reflux disease without  esophagitis 02/11/2018  ?  S/P TAH-BSO 01/29/2015  ?  Acne 11/21/2012  ?  Intraductal papilloma of breast 01/21/2012  ?  Overview  ?    Left breast ?   ?  ?  ?  ?GENERAL REVIEW OF SYSTEMS:  ?  ?General ROS: negative for - chills, fatigue, fever, weight gain or weight loss ?Allergy and Immunology ROS: negative for - hives  ?Hematological and Lymphatic ROS: negative for - bleeding problems or bruising, negative for palpable nodes ?Endocrine ROS: negative for - heat or cold intolerance, hair changes ?Respiratory ROS: negative for - cough, shortness of breath or wheezing ?Cardiovascular ROS: no chest pain or palpitations ?GI ROS: negative for nausea, vomiting, abdominal pain, diarrhea, constipation ?Musculoskeletal ROS: negative for - joint swelling or muscle pain ?Neurological ROS: negative for - confusion, syncope ?Dermatological ROS: negative for pruritus and rash ?Psychiatric: negative for anxiety, depression, difficulty sleeping and memory loss ?  ?MEDICATIONS: ?Current Medications  ?      ?Current Outpatient Medications  ?Medication Sig Dispense Refill  ? calcium carbonate (CALTRATE 600 ORAL) Take by mouth      ? loratadine (CLARITIN) 10 mg tablet Take 10 mg by mouth once daily        ? montelukast (SINGULAIR) 10 mg tablet TAKE 1 TABLET(10 MG) BY MOUTH EVERY NIGHT 90 tablet 1  ? multivit/folic acid/vit K1 (ONE-A-DAY WOMEN'S 50 PLUS ORAL) Take 1 tablet by mouth once daily      ?  omeprazole (PRILOSEC) 20 MG DR capsule Take 20 mg by mouth once daily      ? spironolactone (ALDACTONE) 100 MG tablet TAKE 1 TABLET(100 MG) BY MOUTH EVERY DAY 90 tablet 3  ? triamcinolone (NASACORT AQ) 55 mcg nasal spray as needed      ? ALPRAZolam (XANAX) 0.5 MG tablet        ? estradioL (ESTRACE) 1 MG tablet TAKE 1 TABLET(1 MG) BY MOUTH EVERY DAY (Patient not taking: Reported on 04/06/2022) 90 tablet 3  ? simvastatin (ZOCOR) 20 MG tablet TAKE 1 TABLET(20 MG) BY MOUTH EVERY NIGHT (Patient not taking: Reported on 04/06/2022) 90 tablet 3  ?  ?No  current facility-administered medications for this visit.  ?  ?  ?  ?ALLERGIES: ?Ace inhibitors, Povidone-iodine, Shellfish containing products, Latex, and Ramipril ?  ?PAST MEDICAL HISTORY: ?    ?Past Medical History:  ?Diagnosis Date  ? Allergy 2004  ? Anemia 2000  ? Arthritis    ? GERD (gastroesophageal reflux disease) 2004  ? History of blood transfusion 2000  ? Hyperlipidemia    ? Hypertension 2005  ?  ?  ?PAST SURGICAL HISTORY: ?     ?Past Surgical History:  ?Procedure Laterality Date  ? CESAREAN SECTION   06/17/1994  ? COLONOSCOPY   03/13/2010  ?  @ [place] - "benign polyps"  ? HYSTERECTOMY   01/28/2015  ? BREAST EXCISIONAL BIOPSY Right 06/05/2020  ?  Dr Lesli Albee  ? COLONOSCOPY   09/25/2020  ?  Normal colon/PHx CP/Repeat 69yr/[facility]  ?  ?  ?FAMILY HISTORY: ?     ?Family History  ?Problem Relation Age of Onset  ? Heart disease Mother    ? Diabetes type I Mother    ? Coronary Artery Disease (Blocked arteries around heart) Mother    ?      Deceased  ? Diabetes type II Mother    ?      Deceased  ? Diabetes Mother    ? Prostate cancer Father    ?      Deceased  ? High blood pressure (Hypertension) Father    ?      Deceased  ? Hyperlipidemia (Elevated cholesterol) Father    ?      Deceased  ? High blood pressure (Hypertension) Sister    ? Hyperlipidemia (Elevated cholesterol) Sister    ? Heart disease Brother    ? Diabetes type I Brother    ? Kidney disease Brother    ? Coronary Artery Disease (Blocked arteries around heart) Brother    ?      Deceased  ? Diabetes type II Brother    ?      Deceased  ? Diabetes Brother    ? No Known Problems Brother    ? No Known Problems Brother    ? No Known Problems Brother    ?  ?  ?SOCIAL HISTORY: ?Social History  ?  ?  ?     ?Socioeconomic History  ? Marital status: Divorced  ? Number of children: 1  ? Years of education: 147 ? Highest education level: Master's degree (e.g., MA, MS, MEng, MEd, MSW, MBA)  ?Occupational History  ? 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 Smoking status: Never  ? Smokeless tobacco: Never  ?Vaping Use  ? Vaping Use: Never used  ?Substance and Sexual Activity  ? Alcohol use: Not Currently  ? Drug use:  Never  ? Sexual activity: Not Currently  ?    Partners: Male  ?    Birth control/protection: None  ?  ?  ?  ?PHYSICAL EXAM: ?   ?Vitals:  ?  05/01/22 1015  ?BP: 118/82  ?Pulse: 80  ?  ?Body mass index is 22.86 kg/m?. ?Weight: 56.7 kg (125 lb)  ?  ?GENERAL: Alert, active, oriented x3 ?  ?HEENT: Pupils equal reactive to light. Extraocular movements are intact. Sclera clear. Palpebral conjunctiva normal red color.Pharynx clear. ?  ?NECK: Supple with no palpable mass and no adenopathy. ?  ?LUNGS: Sound clear with no rales rhonchi or wheezes. ?  ?HEART: Regular rhythm S1 and S2 without murmur. ?  ?BREAST: breasts appear normal, no suspicious masses, no skin or nipple changes or axillary nodes. ?  ?ABDOMEN: Soft and depressible, nontender with no palpable mass, no hepatomegaly. ?  ?EXTREMITIES: Well-developed well-nourished symmetrical with no dependent edema. ?  ?NEUROLOGICAL: Awake alert oriented, facial expression symmetrical, moving all extremities. ?  ?REVIEW OF DATA: ?I have reviewed the following data today: ?     ?Office Visit on 04/06/2022  ?Component Date Value  ? WBC (White Blood Cell Co* 04/06/2022 5.3   ? RBC (Red Blood Cell Coun* 04/06/2022 4.38   ? Hemoglobin 04/06/2022 12.5   ? Hematocrit 04/06/2022 39.6   ? MCV (Mean Corpuscular Vo* 04/06/2022 90.4   ? MCH (Mean Corpuscular He* 04/06/2022 28.5   ? MCHC (Mean Corpuscular H* 04/06/2022 31.6 (L)   ? Platelet Count 04/06/2022 211   ? RDW-CV (Red Cell Distrib* 04/06/2022 13.0   ? MPV (Mean Platelet Volum* 04/06/2022 11.8   ? Neutrophils 04/06/2022 2.87   ? Lymphocytes 04/06/2022 1.74   ? Monocytes 04/06/2022 0.50   ? Eosinophils 04/06/2022 0.19   ? Basophils 04/06/2022 0.03   ? Neutrophil % 04/06/2022 53.6   ? Lymphocyte % 04/06/2022 32.6   ? Monocyte % 04/06/2022  9.4   ? Eosinophil % 04/06/2022 3.6   ? Basophil% 04/06/2022 0.6   ? Immature Granulocyte % 04/06/2022 0.2   ? Immature Granulocyte Cou* 04/06/2022 0.01   ? Glucose 04/06/2022 95   ? Sodium 04/06/2022 140

## 2022-05-01 NOTE — H&P (View-Only) (Signed)
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  She had excisional biopsy that shows intraductal papilloma without atypia or malignancy.  Her nipple discharge resolved.  She endorses that she started having nipple discharge again.  She had a diagnostic mammogram that shows a mass in the right upper quadrant of the right breast and another mass at the 3 o'clock position.  This led to core biopsy of both lesions.  Core biopsy of the right upper quadrant shows DCIS, high-grade with comedonecrosis.  Core biopsy of the 3 o'clock position showed intraductal papilloma. ?  ?Due to the high-grade DCIS MRI was recommended.  Since patient wanted to consider breast conserving surgery I proceeded to order the MRI.  MRI showed nonenhancing mass around the known malignancy.  Biopsy showed fibrocystic changes with fat necrosis and usual ductal hyperplasia without atypia or malignancy Second Look with ultrasound of the lymph nodes were negative for any abnormality. ?  ?So far we have right upper outer quadrant 5 mm calcification that are consistent with DCIS, high-grade.  All other abnormalities has been negative for malignancy.  With all these findings I see that is reasonable to proceed with breast conserving surgery as patient requests. ?  ?PROBLEM LIST: ?       ?Problem List  Date Reviewed: 04/06/2022  ?        Noted  ?  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  Anemia 04/19/2020  ?  Colon polyp 04/19/2020  ?  Overview  ?    Benign ?   ?  ?  Dysfunctional uterine bleeding 04/19/2020  ?  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  IBS (irritable bowel syndrome) 04/19/2020  ?  Migraine 04/19/2020  ?  Uterine leiomyoma 04/19/2020  ?  Essential hypertension 02/11/2018  ?  Pure hypercholesterolemia 02/11/2018  ?  Hot flashes, menopausal 02/11/2018  ?  Non-seasonal allergic rhinitis 02/11/2018  ?  Gastroesophageal reflux disease without  esophagitis 02/11/2018  ?  S/P TAH-BSO 01/29/2015  ?  Acne 11/21/2012  ?  Intraductal papilloma of breast 01/21/2012  ?  Overview  ?    Left breast ?   ?  ?  ?  ?GENERAL REVIEW OF SYSTEMS:  ?  ?General ROS: negative for - chills, fatigue, fever, weight gain or weight loss ?Allergy and Immunology ROS: negative for - hives  ?Hematological and Lymphatic ROS: negative for - bleeding problems or bruising, negative for palpable nodes ?Endocrine ROS: negative for - heat or cold intolerance, hair changes ?Respiratory ROS: negative for - cough, shortness of breath or wheezing ?Cardiovascular ROS: no chest pain or palpitations ?GI ROS: negative for nausea, vomiting, abdominal pain, diarrhea, constipation ?Musculoskeletal ROS: negative for - joint swelling or muscle pain ?Neurological ROS: negative for - confusion, syncope ?Dermatological ROS: negative for pruritus and rash ?Psychiatric: negative for anxiety, depression, difficulty sleeping and memory loss ?  ?MEDICATIONS: ?Current Medications  ?      ?Current Outpatient Medications  ?Medication Sig Dispense Refill  ? calcium carbonate (CALTRATE 600 ORAL) Take by mouth      ? loratadine (CLARITIN) 10 mg tablet Take 10 mg by mouth once daily        ? montelukast (SINGULAIR) 10 mg tablet TAKE 1 TABLET(10 MG) BY MOUTH EVERY NIGHT 90 tablet 1  ? multivit/folic acid/vit K1 (ONE-A-DAY WOMEN'S 50 PLUS ORAL) Take 1 tablet by mouth once daily      ?  omeprazole (PRILOSEC) 20 MG DR capsule Take 20 mg by mouth once daily      ? spironolactone (ALDACTONE) 100 MG tablet TAKE 1 TABLET(100 MG) BY MOUTH EVERY DAY 90 tablet 3  ? triamcinolone (NASACORT AQ) 55 mcg nasal spray as needed      ? ALPRAZolam (XANAX) 0.5 MG tablet        ? estradioL (ESTRACE) 1 MG tablet TAKE 1 TABLET(1 MG) BY MOUTH EVERY DAY (Patient not taking: Reported on 04/06/2022) 90 tablet 3  ? simvastatin (ZOCOR) 20 MG tablet TAKE 1 TABLET(20 MG) BY MOUTH EVERY NIGHT (Patient not taking: Reported on 04/06/2022) 90 tablet 3  ?  ?No  current facility-administered medications for this visit.  ?  ?  ?  ?ALLERGIES: ?Ace inhibitors, Povidone-iodine, Shellfish containing products, Latex, and Ramipril ?  ?PAST MEDICAL HISTORY: ?    ?Past Medical History:  ?Diagnosis Date  ? Allergy 2004  ? Anemia 2000  ? Arthritis    ? GERD (gastroesophageal reflux disease) 2004  ? History of blood transfusion 2000  ? Hyperlipidemia    ? Hypertension 2005  ?  ?  ?PAST SURGICAL HISTORY: ?     ?Past Surgical History:  ?Procedure Laterality Date  ? CESAREAN SECTION   06/17/1994  ? COLONOSCOPY   03/13/2010  ?  @ [place] - "benign polyps"  ? HYSTERECTOMY   01/28/2015  ? BREAST EXCISIONAL BIOPSY Right 06/05/2020  ?  Dr Lesli Albee  ? COLONOSCOPY   09/25/2020  ?  Normal colon/PHx CP/Repeat 68yr/[facility]  ?  ?  ?FAMILY HISTORY: ?     ?Family History  ?Problem Relation Age of Onset  ? Heart disease Mother    ? Diabetes type I Mother    ? Coronary Artery Disease (Blocked arteries around heart) Mother    ?      Deceased  ? Diabetes type II Mother    ?      Deceased  ? Diabetes Mother    ? Prostate cancer Father    ?      Deceased  ? High blood pressure (Hypertension) Father    ?      Deceased  ? Hyperlipidemia (Elevated cholesterol) Father    ?      Deceased  ? High blood pressure (Hypertension) Sister    ? Hyperlipidemia (Elevated cholesterol) Sister    ? Heart disease Brother    ? Diabetes type I Brother    ? Kidney disease Brother    ? Coronary Artery Disease (Blocked arteries around heart) Brother    ?      Deceased  ? Diabetes type II Brother    ?      Deceased  ? Diabetes Brother    ? No Known Problems Brother    ? No Known Problems Brother    ? No Known Problems Brother    ?  ?  ?SOCIAL HISTORY: ?Social History  ?  ?  ?     ?Socioeconomic History  ? Marital status: Divorced  ? Number of children: 1  ? Years of education: 137 ? Highest education level: Master's degree (e.g., MA, MS, MEng, MEd, MSW, MBA)  ?Occupational History  ? 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 Smoking status: Never  ? Smokeless tobacco: Never  ?Vaping Use  ? Vaping Use: Never used  ?Substance and Sexual Activity  ? Alcohol use: Not Currently  ? Drug use:  Never  ? Sexual activity: Not Currently  ?    Partners: Male  ?    Birth control/protection: None  ?  ?  ?  ?PHYSICAL EXAM: ?   ?Vitals:  ?  05/01/22 1015  ?BP: 118/82  ?Pulse: 80  ?  ?Body mass index is 22.86 kg/m?. ?Weight: 56.7 kg (125 lb)  ?  ?GENERAL: Alert, active, oriented x3 ?  ?HEENT: Pupils equal reactive to light. Extraocular movements are intact. Sclera clear. Palpebral conjunctiva normal red color.Pharynx clear. ?  ?NECK: Supple with no palpable mass and no adenopathy. ?  ?LUNGS: Sound clear with no rales rhonchi or wheezes. ?  ?HEART: Regular rhythm S1 and S2 without murmur. ?  ?BREAST: breasts appear normal, no suspicious masses, no skin or nipple changes or axillary nodes. ?  ?ABDOMEN: Soft and depressible, nontender with no palpable mass, no hepatomegaly. ?  ?EXTREMITIES: Well-developed well-nourished symmetrical with no dependent edema. ?  ?NEUROLOGICAL: Awake alert oriented, facial expression symmetrical, moving all extremities. ?  ?REVIEW OF DATA: ?I have reviewed the following data today: ?     ?Office Visit on 04/06/2022  ?Component Date Value  ? WBC (White Blood Cell Co* 04/06/2022 5.3   ? RBC (Red Blood Cell Coun* 04/06/2022 4.38   ? Hemoglobin 04/06/2022 12.5   ? Hematocrit 04/06/2022 39.6   ? MCV (Mean Corpuscular Vo* 04/06/2022 90.4   ? MCH (Mean Corpuscular He* 04/06/2022 28.5   ? MCHC (Mean Corpuscular H* 04/06/2022 31.6 (L)   ? Platelet Count 04/06/2022 211   ? RDW-CV (Red Cell Distrib* 04/06/2022 13.0   ? MPV (Mean Platelet Volum* 04/06/2022 11.8   ? Neutrophils 04/06/2022 2.87   ? Lymphocytes 04/06/2022 1.74   ? Monocytes 04/06/2022 0.50   ? Eosinophils 04/06/2022 0.19   ? Basophils 04/06/2022 0.03   ? Neutrophil % 04/06/2022 53.6   ? Lymphocyte % 04/06/2022 32.6   ? Monocyte % 04/06/2022  9.4   ? Eosinophil % 04/06/2022 3.6   ? Basophil% 04/06/2022 0.6   ? Immature Granulocyte % 04/06/2022 0.2   ? Immature Granulocyte Cou* 04/06/2022 0.01   ? Glucose 04/06/2022 95   ? Sodium 04/06/2022 140

## 2022-05-04 ENCOUNTER — Other Ambulatory Visit: Payer: Self-pay | Admitting: Emergency Medicine

## 2022-05-04 ENCOUNTER — Other Ambulatory Visit
Admission: RE | Admit: 2022-05-04 | Discharge: 2022-05-04 | Disposition: A | Discharge status: Home / Self Care | Payer: BC Managed Care – PPO | Source: Ambulatory Visit | Attending: General Surgery | Admitting: General Surgery

## 2022-05-04 ENCOUNTER — Other Ambulatory Visit: Payer: Self-pay

## 2022-05-04 ENCOUNTER — Other Ambulatory Visit: Payer: Self-pay | Admitting: General Surgery

## 2022-05-04 DIAGNOSIS — Z01812 Encounter for preprocedural laboratory examination: Secondary | ICD-10-CM

## 2022-05-04 DIAGNOSIS — D0511 Intraductal carcinoma in situ of right breast: Secondary | ICD-10-CM

## 2022-05-04 HISTORY — DX: Headache, unspecified: R51.9

## 2022-05-04 NOTE — Patient Instructions (Addendum)
Your procedure is scheduled on: 05/06/22 - Wednesday ?Report to the Registration Desk on the 1st floor of the [facility]. ?To find out your arrival time, please call [phone] between 1PM - 3PM on: 05/05/22 - Tuesday  ?If your arrival time is 6:00 am, do not arrive prior to that time as the [facility] entrance doors do not open until 6:00 am. ? ?REMEMBER: ?Instructions that are not followed completely may result in serious medical risk, up to and including death; or upon the discretion of your surgeon and anesthesiologist your surgery may need to be rescheduled. ? ?Do not eat food or drink any fluids after midnight the night before surgery.  ?No gum chewing, lozengers or hard candies. ? ?TAKE THESE MEDICATIONS THE MORNING OF SURGERY WITH A SIP OF WATER: ? ?- omeprazole (PRILOSEC) 20 MG capsule, (take one the night before and one on the morning of surgery - helps to prevent nausea after surgery.) ?- triamcinolone (NASACORT) 55 MCG/ACT AERO nasal  ? ?One week prior to surgery: ?Stop Anti-inflammatories (NSAIDS) such as Advil, Aleve, Ibuprofen, Motrin, Naproxen, Naprosyn and Aspirin based products such as Excedrin, Goodys Powder, BC Powder. ? ?Stop ANY OVER THE COUNTER supplements until after surgery.Calcium Carbonate (CALTRATE 600 PO), Multiple Vitamins-Minerals  ? ?You may take Tylenol if needed for pain up until the day of surgery. ? ?No Alcohol for 24 hours before or after surgery. ? ?No Smoking including e-cigarettes for 24 hours prior to surgery.  ?No chewable tobacco products for at least 6 hours prior to surgery.  ?No nicotine patches on the day of surgery. ? ?Do not use any "recreational" drugs for at least a week prior to your surgery.  ?Please be advised that the combination of cocaine and anesthesia may have negative outcomes, up to and including death. ?If you test positive for cocaine, your surgery will be cancelled. ? ?On the morning of surgery brush your teeth with toothpaste and water, you may  rinse your mouth with mouthwash if you wish. ?Do not swallow any toothpaste or mouthwash. ? ?Do not wear jewelry, make-up, hairpins, clips or nail polish. ? ?Do not wear lotions, powders, or perfumes.  ? ?Do not shave body from the neck down 48 hours prior to surgery just in case you cut yourself which could leave a site for infection.  ?Also, freshly shaved skin may become irritated if using the CHG soap. ? ?Contact lenses, hearing aids and dentures may not be worn into surgery. ? ?Do not bring valuables to the hospital. [facility] is not responsible for any missing/lost belongings or valuables.  ? ?Notify your doctor if there is any change in your medical condition (cold, fever, infection). ? ?Wear comfortable clothing (specific to your surgery type) to the hospital. ? ?After surgery, you can help prevent lung complications by doing breathing exercises.  ?Take deep breaths and cough every 1-2 hours. Your doctor may order a device called an Incentive Spirometer to help you take deep breaths. ?When coughing or sneezing, hold a pillow firmly against your incision with both hands. This is called ?splinting.? Doing this helps protect your incision. It also decreases belly discomfort. ? ?If you are being admitted to the hospital overnight, leave your suitcase in the car. ?After surgery it may be brought to your room. ? ?If you are being discharged the day of surgery, you will not be allowed to drive home. ?You will need a responsible adult (18 years or older) to drive you home and stay with  you that night.  ? ?If you are taking public transportation, you will need to have a responsible adult (18 years or older) with you. ?Please confirm with your physician that it is acceptable to use public transportation.  ? ?Please call the [place] Dept. at [phone] if you have any questions about these instructions. ? ?Surgery Visitation Policy: ? ?Patients undergoing a surgery or procedure may have two family  members or support persons with them as long as the person is not COVID-19 positive or experiencing its symptoms.  ? ?Inpatient Visitation:   ? ?Visiting hours are 7 a.m. to 8 p.m. ?Up to four visitors are allowed at one time in a patient room, including children. The visitors may rotate out with other people during the day. One designated support person (adult) may remain overnight.  ?

## 2022-05-05 ENCOUNTER — Ambulatory Visit
Admission: RE | Admit: 2022-05-05 | Discharge: 2022-05-05 | Disposition: A | Discharge status: Home / Self Care | Payer: BC Managed Care – PPO | Source: Ambulatory Visit | Attending: General Surgery | Admitting: General Surgery

## 2022-05-05 ENCOUNTER — Other Ambulatory Visit: Payer: Self-pay | Admitting: General Surgery

## 2022-05-05 ENCOUNTER — Ambulatory Visit
Admission: RE | Admit: 2022-05-05 | Discharge: 2022-05-05 | Disposition: A | Discharge status: Home / Self Care | Payer: BC Managed Care – PPO | Source: Ambulatory Visit | Attending: Emergency Medicine | Admitting: Emergency Medicine

## 2022-05-05 DIAGNOSIS — D0511 Intraductal carcinoma in situ of right breast: Secondary | ICD-10-CM | POA: Diagnosis not present

## 2022-05-05 DIAGNOSIS — R928 Other abnormal and inconclusive findings on diagnostic imaging of breast: Secondary | ICD-10-CM | POA: Insufficient documentation

## 2022-05-06 ENCOUNTER — Ambulatory Visit: Payer: BC Managed Care – PPO

## 2022-05-06 ENCOUNTER — Ambulatory Visit: Payer: BC Managed Care – PPO | Admitting: Urgent Care

## 2022-05-06 ENCOUNTER — Ambulatory Visit
Admission: RE | Admit: 2022-05-06 | Discharge: 2022-05-06 | Disposition: A | Discharge status: Home / Self Care | Payer: BC Managed Care – PPO | Source: Ambulatory Visit | Attending: General Surgery | Admitting: General Surgery

## 2022-05-06 ENCOUNTER — Encounter: Payer: Self-pay | Admitting: General Surgery

## 2022-05-06 ENCOUNTER — Other Ambulatory Visit: Payer: Self-pay

## 2022-05-06 ENCOUNTER — Ambulatory Visit
Admission: RE | Admit: 2022-05-06 | Discharge: 2022-05-06 | Disposition: A | Discharge status: Home / Self Care | Payer: BC Managed Care – PPO | Attending: General Surgery | Admitting: General Surgery

## 2022-05-06 ENCOUNTER — Encounter
Admission: RE | Disposition: A | Discharge status: Home / Self Care | Payer: Self-pay | Source: Home / Self Care | Attending: General Surgery

## 2022-05-06 DIAGNOSIS — E785 Hyperlipidemia, unspecified: Secondary | ICD-10-CM | POA: Insufficient documentation

## 2022-05-06 DIAGNOSIS — D0511 Intraductal carcinoma in situ of right breast: Secondary | ICD-10-CM

## 2022-05-06 DIAGNOSIS — Z6822 Body mass index (BMI) 22.0-22.9, adult: Secondary | ICD-10-CM | POA: Insufficient documentation

## 2022-05-06 DIAGNOSIS — E669 Obesity, unspecified: Secondary | ICD-10-CM | POA: Diagnosis not present

## 2022-05-06 DIAGNOSIS — K219 Gastro-esophageal reflux disease without esophagitis: Secondary | ICD-10-CM | POA: Diagnosis not present

## 2022-05-06 DIAGNOSIS — I10 Essential (primary) hypertension: Secondary | ICD-10-CM | POA: Insufficient documentation

## 2022-05-06 DIAGNOSIS — Z01812 Encounter for preprocedural laboratory examination: Secondary | ICD-10-CM

## 2022-05-06 DIAGNOSIS — C50911 Malignant neoplasm of unspecified site of right female breast: Secondary | ICD-10-CM | POA: Diagnosis present

## 2022-05-06 HISTORY — PX: PARTIAL MASTECTOMY WITH AXILLARY SENTINEL LYMPH NODE BIOPSY: SHX6004

## 2022-05-06 SURGERY — PARTIAL MASTECTOMY WITH AXILLARY SENTINEL LYMPH NODE BIOPSY
Anesthesia: General | Site: Breast | Laterality: Right

## 2022-05-06 MED ORDER — CEFAZOLIN SODIUM-DEXTROSE 2-4 GM/100ML-% IV SOLN
2.0000 g | INTRAVENOUS | Status: AC
  Administered 2022-05-06: 2 g via INTRAVENOUS

## 2022-05-06 MED ORDER — FENTANYL CITRATE (PF) 100 MCG/2ML IJ SOLN
INTRAMUSCULAR | Status: AC
  Administered 2022-05-06: 25 ug via INTRAVENOUS
  Filled 2022-05-06: qty 2

## 2022-05-06 MED ORDER — CEFAZOLIN SODIUM-DEXTROSE 2-4 GM/100ML-% IV SOLN
INTRAVENOUS | Status: AC
  Filled 2022-05-06: qty 100

## 2022-05-06 MED ORDER — TECHNETIUM TC 99M TILMANOCEPT KIT
1.1000 | PACK | Freq: Once | INTRAVENOUS | Status: AC | PRN
  Administered 2022-05-06: 1.1 via INTRADERMAL

## 2022-05-06 MED ORDER — ORAL CARE MOUTH RINSE: 15.0000 mL | Freq: Once | OROMUCOSAL | Status: AC

## 2022-05-06 MED ORDER — DROPERIDOL 2.5 MG/ML IJ SOLN: 0.6250 mg | Freq: Once | INTRAMUSCULAR | Status: DC | PRN

## 2022-05-06 MED ORDER — METHYLENE BLUE 1 % INJ SOLN
INTRAVENOUS | Status: AC
  Filled 2022-05-06: qty 10

## 2022-05-06 MED ORDER — APREPITANT 40 MG PO CAPS
ORAL_CAPSULE | ORAL | Status: AC
  Filled 2022-05-06: qty 1

## 2022-05-06 MED ORDER — OXYCODONE HCL 5 MG PO TABS: 5.0000 mg | ORAL_TABLET | Freq: Once | ORAL | Status: AC | PRN

## 2022-05-06 MED ORDER — LIDOCAINE HCL (PF) 2 % IJ SOLN
INTRAMUSCULAR | Status: AC
  Filled 2022-05-06: qty 5

## 2022-05-06 MED ORDER — CELECOXIB 200 MG PO CAPS
ORAL_CAPSULE | ORAL | Status: AC
  Administered 2022-05-06: 200 mg via ORAL
  Filled 2022-05-06: qty 1

## 2022-05-06 MED ORDER — CHLORHEXIDINE GLUCONATE 0.12 % MT SOLN: 15.0000 mL | Freq: Once | OROMUCOSAL | Status: AC

## 2022-05-06 MED ORDER — FENTANYL CITRATE (PF) 100 MCG/2ML IJ SOLN
25.0000 ug | INTRAMUSCULAR | Status: DC | PRN
  Administered 2022-05-06: 25 ug via INTRAVENOUS

## 2022-05-06 MED ORDER — DEXAMETHASONE SODIUM PHOSPHATE 10 MG/ML IJ SOLN
INTRAMUSCULAR | Status: DC | PRN
  Administered 2022-05-06: 10 mg via INTRAVENOUS

## 2022-05-06 MED ORDER — PROPOFOL 500 MG/50ML IV EMUL
INTRAVENOUS | Status: AC
  Filled 2022-05-06: qty 50

## 2022-05-06 MED ORDER — LACTATED RINGERS IV SOLN: INTRAVENOUS | Status: DC

## 2022-05-06 MED ORDER — PROPOFOL 10 MG/ML IV BOLUS
INTRAVENOUS | Status: AC
  Filled 2022-05-06: qty 20

## 2022-05-06 MED ORDER — BUPIVACAINE-EPINEPHRINE (PF) 0.5% -1:200000 IJ SOLN
INTRAMUSCULAR | Status: DC | PRN
  Administered 2022-05-06: 15 mL

## 2022-05-06 MED ORDER — CHLORHEXIDINE GLUCONATE 0.12 % MT SOLN
OROMUCOSAL | Status: AC
  Administered 2022-05-06: 15 mL via OROMUCOSAL
  Filled 2022-05-06: qty 15

## 2022-05-06 MED ORDER — MIDAZOLAM HCL 2 MG/2ML IJ SOLN
INTRAMUSCULAR | Status: AC
  Filled 2022-05-06: qty 2

## 2022-05-06 MED ORDER — PROPOFOL 10 MG/ML IV BOLUS
INTRAVENOUS | Status: DC | PRN
  Administered 2022-05-06: 130 mg via INTRAVENOUS

## 2022-05-06 MED ORDER — OXYCODONE HCL 5 MG PO TABS
ORAL_TABLET | ORAL | Status: AC
  Administered 2022-05-06: 5 mg via ORAL
  Filled 2022-05-06: qty 1

## 2022-05-06 MED ORDER — PHENYLEPHRINE HCL (PRESSORS) 10 MG/ML IV SOLN
INTRAVENOUS | Status: DC | PRN
Start: 2022-05-06 — End: 2022-05-06
  Administered 2022-05-06: 80 ug via INTRAVENOUS
  Administered 2022-05-06: 160 ug via INTRAVENOUS
  Administered 2022-05-06 (×5): 80 ug via INTRAVENOUS

## 2022-05-06 MED ORDER — MIDAZOLAM HCL 2 MG/2ML IJ SOLN
INTRAMUSCULAR | Status: DC | PRN
  Administered 2022-05-06: 2 mg via INTRAVENOUS

## 2022-05-06 MED ORDER — ACETAMINOPHEN 500 MG PO TABS: 1000.0000 mg | ORAL_TABLET | Freq: Once | ORAL | Status: AC

## 2022-05-06 MED ORDER — LIDOCAINE HCL (PF) 2 % IJ SOLN
INTRAMUSCULAR | Status: DC | PRN
  Administered 2022-05-06: 80 mg via INTRADERMAL

## 2022-05-06 MED ORDER — OXYCODONE HCL 5 MG/5ML PO SOLN: 5.0000 mg | Freq: Once | ORAL | Status: AC | PRN

## 2022-05-06 MED ORDER — PROPOFOL 500 MG/50ML IV EMUL
INTRAVENOUS | Status: DC | PRN
Start: 2022-05-06 — End: 2022-05-06
  Administered 2022-05-06: 120 ug/kg/min via INTRAVENOUS

## 2022-05-06 MED ORDER — FENTANYL CITRATE (PF) 100 MCG/2ML IJ SOLN
INTRAMUSCULAR | Status: AC
  Filled 2022-05-06: qty 2

## 2022-05-06 MED ORDER — APREPITANT 40 MG PO CAPS
40.0000 mg | ORAL_CAPSULE | Freq: Once | ORAL | Status: AC
  Administered 2022-05-06: 40 mg via ORAL

## 2022-05-06 MED ORDER — CELECOXIB 200 MG PO CAPS: 200.0000 mg | ORAL_CAPSULE | Freq: Once | ORAL | Status: AC

## 2022-05-06 MED ORDER — GABAPENTIN 300 MG PO CAPS: 300.0000 mg | ORAL_CAPSULE | Freq: Once | ORAL | Status: AC

## 2022-05-06 MED ORDER — DEXAMETHASONE SODIUM PHOSPHATE 10 MG/ML IJ SOLN
INTRAMUSCULAR | Status: AC
  Filled 2022-05-06: qty 1

## 2022-05-06 MED ORDER — PHENYLEPHRINE HCL-NACL 20-0.9 MG/250ML-% IV SOLN
INTRAVENOUS | Status: DC | PRN
  Administered 2022-05-06: 30 ug/min via INTRAVENOUS

## 2022-05-06 MED ORDER — DEXMEDETOMIDINE HCL IN NACL 200 MCG/50ML IV SOLN
INTRAVENOUS | Status: DC | PRN
  Administered 2022-05-06: 12 ug via INTRAVENOUS

## 2022-05-06 MED ORDER — ONDANSETRON HCL 4 MG/2ML IJ SOLN
INTRAMUSCULAR | Status: DC | PRN
  Administered 2022-05-06: 4 mg via INTRAVENOUS

## 2022-05-06 MED ORDER — FENTANYL CITRATE (PF) 100 MCG/2ML IJ SOLN
INTRAMUSCULAR | Status: DC | PRN
  Administered 2022-05-06: 25 ug via INTRAVENOUS

## 2022-05-06 MED ORDER — HYDROCODONE-ACETAMINOPHEN 5-325 MG PO TABS: 1.0000 | ORAL_TABLET | ORAL | 0 refills | Status: AC | PRN

## 2022-05-06 MED ORDER — PROMETHAZINE HCL 25 MG/ML IJ SOLN: 6.2500 mg | INTRAMUSCULAR | Status: DC | PRN

## 2022-05-06 MED ORDER — PHENYLEPHRINE HCL-NACL 20-0.9 MG/250ML-% IV SOLN
INTRAVENOUS | Status: AC
  Filled 2022-05-06: qty 250

## 2022-05-06 MED ORDER — GABAPENTIN 300 MG PO CAPS
ORAL_CAPSULE | ORAL | Status: AC
  Administered 2022-05-06: 300 mg via ORAL
  Filled 2022-05-06: qty 1

## 2022-05-06 MED ORDER — ACETAMINOPHEN 500 MG PO TABS
ORAL_TABLET | ORAL | Status: AC
  Administered 2022-05-06: 1000 mg via ORAL
  Filled 2022-05-06: qty 2

## 2022-05-06 MED ORDER — ONDANSETRON HCL 4 MG/2ML IJ SOLN
INTRAMUSCULAR | Status: AC
  Filled 2022-05-06: qty 2

## 2022-05-06 MED ORDER — HEMOSTATIC AGENTS (NO CHARGE) OPTIME
TOPICAL | Status: DC | PRN
  Administered 2022-05-06: 1 via TOPICAL

## 2022-05-06 MED ORDER — BUPIVACAINE-EPINEPHRINE (PF) 0.5% -1:200000 IJ SOLN
INTRAMUSCULAR | Status: AC
  Filled 2022-05-06: qty 30

## 2022-05-06 SURGICAL SUPPLY — 53 items
ADH SKN CLS APL DERMABOND .7 (GAUZE/BANDAGES/DRESSINGS) ×1
APL PRP STRL LF DISP 70% ISPRP (MISCELLANEOUS) ×1
BLADE SURG 15 STRL LF DISP TIS (BLADE) ×2 IMPLANT
BLADE SURG 15 STRL SS (BLADE) ×4
CHLORAPREP W/TINT 26 (MISCELLANEOUS) ×2 IMPLANT
CNTNR SPEC 2.5X3XGRAD LEK (MISCELLANEOUS)
CONT SPEC 4OZ STER OR WHT (MISCELLANEOUS)
CONT SPEC 4OZ STRL OR WHT (MISCELLANEOUS)
CONTAINER SPEC 2.5X3XGRAD LEK (MISCELLANEOUS) IMPLANT
DERMABOND ADVANCED (GAUZE/BANDAGES/DRESSINGS) ×1
DERMABOND ADVANCED .7 DNX12 (GAUZE/BANDAGES/DRESSINGS) ×1 IMPLANT
DEVICE DUBIN SPECIMEN MAMMOGRA (MISCELLANEOUS) ×3 IMPLANT
DRAPE LAPAROTOMY TRNSV 106X77 (MISCELLANEOUS) ×2 IMPLANT
DRSG GAUZE FLUFF 36X18 (GAUZE/BANDAGES/DRESSINGS) IMPLANT
ELECT CAUTERY BLADE 6.4 (BLADE) ×2 IMPLANT
ELECT REM PT RETURN 9FT ADLT (ELECTROSURGICAL) ×2
ELECTRODE REM PT RTRN 9FT ADLT (ELECTROSURGICAL) ×1 IMPLANT
GAUZE 4X4 16PLY ~~LOC~~+RFID DBL (SPONGE) ×2 IMPLANT
GLOVE BIO SURGEON STRL SZ 6.5 (GLOVE) ×4 IMPLANT
GLOVE BIOGEL PI IND STRL 6.5 (GLOVE) ×1 IMPLANT
GLOVE BIOGEL PI INDICATOR 6.5 (GLOVE) ×3
GOWN STRL REUS W/ TWL LRG LVL3 (GOWN DISPOSABLE) ×2 IMPLANT
GOWN STRL REUS W/TWL LRG LVL3 (GOWN DISPOSABLE) ×6
HEMOSTAT ARISTA ABSORB 1G (HEMOSTASIS) ×1 IMPLANT
KIT MARKER MARGIN INK (KITS) ×1 IMPLANT
KIT TURNOVER KIT A (KITS) ×2 IMPLANT
LABEL OR SOLS (LABEL) ×2 IMPLANT
MANIFOLD NEPTUNE II (INSTRUMENTS) ×2 IMPLANT
MARGIN MAP 10MM (MISCELLANEOUS) ×1 IMPLANT
MARKER MARGIN CORRECT CLIP (MARKER) ×1 IMPLANT
NDL HYPO 25X1 1.5 SAFETY (NEEDLE) ×1 IMPLANT
NEEDLE HYPO 22GX1.5 SAFETY (NEEDLE) ×2 IMPLANT
NEEDLE HYPO 25X1 1.5 SAFETY (NEEDLE) ×2 IMPLANT
PACK BASIN MINOR ARMC (MISCELLANEOUS) ×2 IMPLANT
RETRACTOR RING XSMALL (MISCELLANEOUS) IMPLANT
RTRCTR WOUND ALEXIS 13CM XS SH (MISCELLANEOUS)
SET LOCALIZER 20 PROBE US (MISCELLANEOUS) ×1 IMPLANT
SLEVE PROBE SENORX GAMMA FIND (MISCELLANEOUS) ×2 IMPLANT
SUT ETHILON 3-0 FS-10 30 BLK (SUTURE) ×2
SUT MNCRL 4-0 (SUTURE) ×4
SUT MNCRL 4-0 27XMFL (SUTURE) ×2
SUT SILK 2 0 SH (SUTURE) ×1 IMPLANT
SUT VIC AB 2-0 SH 27 (SUTURE) ×2
SUT VIC AB 2-0 SH 27XBRD (SUTURE) IMPLANT
SUT VIC AB 3-0 SH 27 (SUTURE) ×4
SUT VIC AB 3-0 SH 27X BRD (SUTURE) ×2 IMPLANT
SUTURE EHLN 3-0 FS-10 30 BLK (SUTURE) ×1 IMPLANT
SUTURE MNCRL 4-0 27XMF (SUTURE) ×2 IMPLANT
SYR 10ML LL (SYRINGE) ×4 IMPLANT
SYR BULB IRRIG 60ML STRL (SYRINGE) ×2 IMPLANT
TRAP NEPTUNE SPECIMEN COLLECT (MISCELLANEOUS) ×2 IMPLANT
WATER STERILE IRR 1000ML POUR (IV SOLUTION) ×2 IMPLANT
WATER STERILE IRR 500ML POUR (IV SOLUTION) ×2 IMPLANT

## 2022-05-06 NOTE — Anesthesia Postprocedure Evaluation (Signed)
Anesthesia Post Note ? ?Patient: SHERIA ROSELLO ? ?Procedure(s) Performed: PARTIAL MASTECTOMY WITH AXILLARY SENTINEL LYMPH NODE BIOPSY With RF tag (Right: Breast) ? ?Patient location during evaluation: PACU ?Anesthesia Type: General ?Level of consciousness: awake and alert, oriented and patient cooperative ?Pain management: pain level controlled ?Vital Signs Assessment: post-procedure vital signs reviewed and stable ?Respiratory status: spontaneous breathing, nonlabored ventilation and respiratory function stable ?Cardiovascular status: blood pressure returned to baseline and stable ?Postop Assessment: adequate PO intake ?Anesthetic complications: no ? ? ?No notable events documented. ? ? ?Last Vitals:  ?Vitals:  ? 05/06/22 1618 05/06/22 1630  ?BP: 123/90 123/83  ?Pulse: 65 62  ?Resp: (!) 24 (!) 23  ?Temp:    ?SpO2: 98% 99%  ?  ?Last Pain:  ?Vitals:  ? 05/06/22 1630  ?TempSrc:   ?PainSc: 5   ? ? ?  ?  ?  ?  ?  ?  ? ?Darrin Nipper ? ? ? ? ?

## 2022-05-06 NOTE — Transfer of Care (Signed)
Immediate Anesthesia Transfer of Care Note ? ?Patient: Jacqueline Murray ? ?Procedure(s) Performed: PARTIAL MASTECTOMY WITH AXILLARY SENTINEL LYMPH NODE BIOPSY With RF tag (Right: Breast) ? ?Patient Location: PACU ? ?Anesthesia Type:General ? ?Level of Consciousness: drowsy and patient cooperative ? ?Airway & Oxygen Therapy: Patient Spontanous Breathing ? ?Post-op Assessment: Report given to RN and Post -op Vital signs reviewed and stable ? ?Post vital signs: Reviewed and stable ? ?Last Vitals:  ?Vitals Value Taken Time  ?BP 98/69 05/06/22 1557  ?Temp    ?Pulse 65 05/06/22 1557  ?Resp 18 05/06/22 1557  ?SpO2 97 % 05/06/22 1557  ?Vitals shown include unvalidated device data. ? ?Last Pain:  ?Vitals:  ? 05/06/22 1230  ?TempSrc: Temporal  ?PainSc: 2   ?   ? ?  ? ?Complications: No notable events documented. ?

## 2022-05-06 NOTE — Anesthesia Preprocedure Evaluation (Addendum)
Anesthesia Evaluation  ?Patient identified by MRN, date of birth, ID band ?Patient awake ? ? ? ?Reviewed: ?Allergy & Precautions, NPO status , Patient's Chart, lab work & pertinent test results ? ?History of Anesthesia Complications ?(+) PONV and history of anesthetic complications ? ?Airway ?Mallampati: II ? ?TM Distance: >3 FB ?Neck ROM: Full ? ? ? Dental ?no notable dental hx. ? ?  ?Pulmonary ?neg pulmonary ROS, neg sleep apnea, neg COPD,  ?  ?breath sounds clear to auscultation- rhonchi ?(-) wheezing ? ? ? ? ? Cardiovascular ?Exercise Tolerance: Good ?hypertension, Pt. on medications ?(-) CAD, (-) Past MI, (-) Cardiac Stents and (-) CABG  ?Rhythm:Regular Rate:Normal ?- Systolic murmurs and - Diastolic murmurs ? ?  ?Neuro/Psych ?neg Seizures negative neurological ROS ? negative psych ROS  ? GI/Hepatic ?Neg liver ROS, GERD  Controlled,  ?Endo/Other  ?negative endocrine ROSneg diabetes ? Renal/GU ?negative Renal ROS  ? ?  ?Musculoskeletal ?negative musculoskeletal ROS ?(+)  ? Abdominal ?Normal abdominal exam  (+) - obese,   ?Peds ? Hematology ?negative hematology ROS ?(+)   ?Anesthesia Other Findings ?Ductal carcinoma in situ of right breast ? ?Past Medical History: ?No date: Anemia ?No date: Complication of anesthesia ?No date: GERD (gastroesophageal reflux disease) ?No date: HLD (hyperlipidemia) ?No date: Hypertension ?No date: PONV (postoperative nausea and vomiting) ?    Comment:  EXTREME NAUSEA ? ? Reproductive/Obstetrics ? ?  ? ? ? ? ? ? ? ? ? ? ? ? ? ?  ?  ? ? ? ? ? ? ?Anesthesia Physical ? ?Anesthesia Plan ? ?ASA: II ? ?Anesthesia Plan: General  ? ?Post-op Pain Management: Tylenol PO (pre-op)*, Gabapentin PO (pre-op)* and Celebrex PO (pre-op)*  ? ?Induction: Intravenous ? ?PONV Risk Score and Plan: 3 and TIVA, Midazolam, Ondansetron, Dexamethasone and Aprepitant ? ?Airway Management Planned: LMA ? ?Additional Equipment:  ? ?Intra-op Plan:  ? ?Post-operative Plan: Extubation  in OR ? ?Informed Consent: I have reviewed the patients History and Physical, chart, labs and discussed the procedure including the risks, benefits and alternatives for the proposed anesthesia with the patient or authorized representative who has indicated his/her understanding and acceptance.  ? ? ? ?Dental advisory given ? ?Plan Discussed with: CRNA and Anesthesiologist ? ?Anesthesia Plan Comments: (Pt with PONV for hours after last breast biopsy even after TIVA.  Emend was given today. )  ? ? ? ? ? ?Anesthesia Quick Evaluation ? ?

## 2022-05-06 NOTE — Interval H&P Note (Signed)
History and Physical Interval Note: ? ?05/06/2022 ?1:22 PM ? ?Jacqueline Murray  has presented today for surgery, with the diagnosis of D05.11- Ductal carcinoma in situ of right breast.  The various methods of treatment have been discussed with the patient and family. After consideration of risks, benefits and other options for treatment, the patient has consented to  Procedure(s): ?PARTIAL MASTECTOMY WITH AXILLARY SENTINEL LYMPH NODE BIOPSY With RF tag (Right) as a surgical intervention.  The patient's history has been reviewed, patient examined, no change in status, stable for surgery.  I have reviewed the patient's chart and labs.  Questions were answered to the patient's satisfaction.   ? ? ?Jacqueline Murray ? ? ?

## 2022-05-06 NOTE — Op Note (Signed)
Preoperative diagnosis: Right breast carcinoma. ?                                          Right breast papilloma ? ?Postoperative diagnosis: Same.  ? ?Procedure: Right radiofrequency tag-localized partial mastectomy.  ?                    Right Axillary Sentinel Lymph node biopsy ? ?Anesthesia: GETA ? ?Surgeon: Dr. Windell Moment ? ?Wound Classification: Clean ? ?Indications: Patient is a 56 y.o. female with a nonpalpable right breast mass noted on mammography with core biopsy demonstrating DCIS, high-grade with comedonecrosis Requires radiofrequency tag-localized partial mastectomy for treatment with sentinel lymph node biopsy.  Excision of papilloma to rule out malignancy. ? ?Findings: ?1. Specimen mammography shows marker and tag on specimen ?2. Pathology call refers gross examination of margins was clear ?3. No other palpable mass or lymph node identified.  ? ?Description of procedure: Preoperative radiofrequency tag localization was performed by radiology. In the nuclear medicine suite, the subareolar region was injected with Tc-99 sulfur colloid. Localization studies were reviewed. The patient was taken to the operating room and placed supine on the operating table, and after general anesthesia the right chest and axilla were prepped and draped in the usual sterile fashion. A time-out was completed verifying correct patient, procedure, site, positioning, and implant(s) and/or special equipment prior to beginning this procedure.  ?By comparing the localization studies and interrogation with Localizer device, the probable trajectory and location of the mass was visualized. A circumareolar skin incision was planned in such a way as to minimize the amount of dissection to reach the mass.  ?The skin incision was made. Flaps were raised and the location of the tag was confirmed with Localizer device confirmed. A 2-0 silk figure-of-eight stay suture was placed and used for retraction. Dissection was then taken down  circumferentially, taking care to include the entire localizing tag and a wide margin of grossly normal tissue. ?The specimen and entire localizing tag were removed. The specimen was oriented and sent to radiology with the localization studies. Confirmation was received that the entire target lesion had been resected. The wound was irrigated. Hemostasis was checked. The wound was closed with interrupted sutures of 3-0 Vicryl and a subcuticular suture of Monocryl 3-0. No attempt was made to close the dead space.  ? ?A hand-held gamma probe was used to identify the location of the hottest spot in the axilla. An incision was made around the caudal axillary hairline. Dissection was carried down until subdermal facias was advanced. The probe was placed and again, the point of maximal count was found. Dissection continue until nodule was identified. The probe was placed in contact with the node. The node was excised in its entirety.  An additional hot spot was detected and the node was excised in similar fashion. No additional hot spots were identified. No clinically abnormal nodes were palpated. The procedure was terminated. Hemostasis was achieved and the wound closed in layers with deep interrupted 3-0 Vicryl and skin was closed with subcuticular suture of Monocryl 3-0.  ? ?Another circumareolar incision was done on the medial aspect of the breast. Flaps were raised and the location of the tag was confirmed with Localizer device confirmed. Dissection was then taken down circumferentially, taking care to include the entire localizing tag and a wide margin of grossly normal tissue. ?  The specimen and entire localizing tag were removed. The specimen was oriented and sent to radiology with the localization studies. The wound was irrigated. Hemostasis was checked. The wound was closed with interrupted sutures of 3-0 Vicryl and a subcuticular suture of Monocryl 3-0. No attempt was made to close the dead space.  ? ?The patient  tolerated the procedure well and was taken to the postanesthesia care unit in stable condition.  ? ?Sentinel Node Biopsy Synoptic Operative Report ? ?Operation performed with curative intent:Yes ? ?Tracer(s) used to identify sentinel nodes in the upfront surgery (non-neoadjuvant) setting (select all that apply):Radioactive Tracer ? ?Tracer(s) used to identify sentinel nodes in the neoadjuvant setting (select all that apply):N/A ? ?All nodes (colored or non-colored) present at the end of a dye-filled lymphatic channel were removed:N/A ? ?All significantly radioactive nodes were removed:Yes ? ?All palpable suspicious nodes were removed:N/A ? ?Biopsy-proven positive nodes marked with clips prior to chemotherapy were identified and removed:N/A ? ? ?Specimen: Right Breast mass  ?                   Sentinel Lymph nodes #1, #2 ?                   Right breast papilloma ? ?Complications: None ? ?Estimated Blood Loss: 15 mL ? ?

## 2022-05-06 NOTE — Anesthesia Procedure Notes (Signed)
Procedure Name: LMA Insertion ?Date/Time: 05/06/2022 2:00 PM ?Performed by: Jonna Clark, CRNA ?Pre-anesthesia Checklist: Patient identified, Patient being monitored, Timeout performed, Emergency Drugs available and Suction available ?Patient Re-evaluated:Patient Re-evaluated prior to induction ?Oxygen Delivery Method: Circle system utilized ?Preoxygenation: Pre-oxygenation with 100% oxygen ?Induction Type: IV induction ?Ventilation: Mask ventilation without difficulty ?LMA: LMA inserted ?LMA Size: 3.5 ?Tube type: Oral ?Number of attempts: 1 ?Placement Confirmation: positive ETCO2 and breath sounds checked- equal and bilateral ?Tube secured with: Tape ?Dental Injury: Teeth and Oropharynx as per pre-operative assessment  ? ? ? ? ?

## 2022-05-06 NOTE — Discharge Instructions (Addendum)

## 2022-05-07 ENCOUNTER — Encounter: Payer: Self-pay | Admitting: General Surgery

## 2022-05-13 ENCOUNTER — Other Ambulatory Visit: Payer: Self-pay | Admitting: Anatomic Pathology & Clinical Pathology

## 2022-05-13 LAB — SURGICAL PATHOLOGY

## 2022-05-21 ENCOUNTER — Encounter: Payer: Self-pay | Admitting: Radiation Oncology

## 2022-05-21 ENCOUNTER — Ambulatory Visit
Admission: RE | Admit: 2022-05-21 | Discharge: 2022-05-21 | Disposition: A | Discharge status: Home / Self Care | Payer: BC Managed Care – PPO | Source: Ambulatory Visit | Attending: Radiation Oncology | Admitting: Radiation Oncology

## 2022-05-21 ENCOUNTER — Encounter: Payer: Self-pay | Admitting: Oncology

## 2022-05-21 ENCOUNTER — Inpatient Hospital Stay: Payer: BC Managed Care – PPO | Attending: Oncology | Admitting: Oncology

## 2022-05-21 VITALS — BP 127/85 | HR 74 | Temp 98.0°F | Resp 16 | Wt 124.0 lb

## 2022-05-21 VITALS — BP 127/85 | HR 74 | Temp 98.0°F | Resp 16 | Wt 124.6 lb

## 2022-05-21 DIAGNOSIS — E785 Hyperlipidemia, unspecified: Secondary | ICD-10-CM | POA: Diagnosis not present

## 2022-05-21 DIAGNOSIS — Z79899 Other long term (current) drug therapy: Secondary | ICD-10-CM | POA: Diagnosis not present

## 2022-05-21 DIAGNOSIS — K589 Irritable bowel syndrome without diarrhea: Secondary | ICD-10-CM | POA: Diagnosis not present

## 2022-05-21 DIAGNOSIS — D0511 Intraductal carcinoma in situ of right breast: Secondary | ICD-10-CM | POA: Diagnosis not present

## 2022-05-21 DIAGNOSIS — Z8042 Family history of malignant neoplasm of prostate: Secondary | ICD-10-CM | POA: Insufficient documentation

## 2022-05-21 DIAGNOSIS — Z170 Estrogen receptor positive status [ER+]: Secondary | ICD-10-CM | POA: Diagnosis not present

## 2022-05-21 DIAGNOSIS — Z86010 Personal history of colonic polyps: Secondary | ICD-10-CM | POA: Insufficient documentation

## 2022-05-21 DIAGNOSIS — Z9229 Personal history of other drug therapy: Secondary | ICD-10-CM

## 2022-05-21 DIAGNOSIS — I10 Essential (primary) hypertension: Secondary | ICD-10-CM | POA: Diagnosis not present

## 2022-05-21 DIAGNOSIS — K219 Gastro-esophageal reflux disease without esophagitis: Secondary | ICD-10-CM | POA: Insufficient documentation

## 2022-05-21 DIAGNOSIS — Z1379 Encounter for other screening for genetic and chromosomal anomalies: Secondary | ICD-10-CM

## 2022-05-21 DIAGNOSIS — Z90710 Acquired absence of both cervix and uterus: Secondary | ICD-10-CM | POA: Diagnosis not present

## 2022-05-21 NOTE — Progress Notes (Signed)
Hematology/Oncology Consult note [phone] [phone]         Patient Care Team: Leonel Ramsay, MD as PCP - General (Infectious Diseases) Theodore Demark, RN as Oncology Nurse Navigator Herbert Pun, MD as Consulting Physician (General Surgery) Earlie Server, MD as Consulting Physician (Oncology)  REFERRING PROVIDER: Leonel Ramsay, MD  CHIEF COMPLAINTS/REASON FOR VISIT:   DCIS  HISTORY OF PRESENTING ILLNESS:   Jacqueline Murray is a  56 y.o.  female with PMH listed below was seen in consultation at the request of  Leonel Ramsay, MD  for evaluation of DCIS  Patient has a history of papilloma in 2021. 04/29/2020, bilateral breast MRI showed suspicious clumped non-mass enhancement at 12:00 in the right breast, and an indeterminate suspicious mass at the 9:00 in the right breast. 05/17/2020, 9:00 breast mass positive for papilloma with usual ductal hyperplasia.  12:00 mass was benign. 06/05/2020, right breast excisional biopsy showed benign breast tissue with intraductal papilloma, incidental fibroadenoma, fibrocystic changes, usual ductal hyperplasia, prior biopsy changes.  Negative for atypia and malignancy.  Right breast ductal excisional biopsy was negative for atypia and malignancy.  Patient complains of history of bloody right nipple discharge.  History of papilloma excised in 2021.  She also has a left breast mass was originally seen on 02/12/2020 study and being watched. 02/25/2022, bilateral breast mammogram showed Suspicious calcifications in the upper outer quadrant of the right breast; intra ductal mass in the 3:00 region of the right breast 3 cm from nipple.;  Stable benign-appearing mass in the 3: 30 region of the left breast.  Sonographic evaluation of the right axilla does not show any enlarged adenopathy.  03/09/2022, patient underwent right breast 3:00 3 cm from the nipple, ultrasound-guided biopsy, pathology showed fragment of  intraductal papilloma, with associated usual ductal hyperplasia.  Background mammary parenchyma with fibrocystic changes negative for atypical proliferative breast disease.;  Right upper outer quadrant calcification stereotactic core needle biopsy showed DCIS, high-grade with comedonecrosis.  Calcifications associated with DCIS.  Negative for invasive mammary carcinoma.  Patient has been seen by Dr. Peyton Najjar.  Recommended bilateral MRI for further evaluation of the extent of DCIS 03/20/2022, bilateral breast MRI 1. Large area of non mass enhancement within the superior central right breast adjacent to the marking clip, potentially representing additional DCIS adjacent to the recent site of biopsy. 2. Multiple enhancing masses are demonstrated throughout the right and left breast. These are indeterminate in etiology however may represent a background of papillomatosis. Additional etiologies not excluded. 3. Cortically thickened right axillary lymph node, indeterminate.  Patient was referred to establish care with oncology for further evaluation management.  Patient has no new complaints today.  Some soreness at the site of previous biopsy. Family history is positive for father with prostate cancer.  No family history of breast cancer.  Menarche 56 years of age Age at first birth 76, she has 1 daughter. Patient has taken OCP 20+ years. History of hysterectomy. + Hormone replacement therapy since 2016 /2017, recently stopped Previous right breast excisional biopsy-papilloma  03/20/2022, bilateral breast MRI showed additional non-mass enhancement adjacent to the biopsy area, as well as multiple enhancing masses throughout the right and left breast.  Cortically thickened right axillary lymph node. Patient will get additional biopsy-MRI biopsy of the non-mass enhancement within the superior central right breast, dominant mass within the lower outer right breast, and dominant mass in the left breast.   Ultrasound for second look at the axillary lymph node.  04/09/22, additional fine-needle biopsy right breast superior central core needle biopsy showed usual ductal hyperplasia, columnar cells and fibrocystic changes.  Fat necrosis and foreign body giant cell reaction.  No malignancy identified. Right breast lower outer needle biopsy showed usual ductal hyperplasia and fibrocystic changes.  No malignancy identified. Left breast lower outer needle biopsy showed usual ductal hyperplasia and columnar cell and fibrocystic changes.  No malignancy identified. All were found to be concordant by Dr. Lovey Newcomer.  04/30/2022, second look right axilla showed no suspicious right axillary lymphadenopathy.  Biopsy was canceled . INTERVAL HISTORY Jacqueline Murray is a 56 y.o. female who has above history reviewed by me today presents for follow up visit for management of DCIS  05/06/2022, patient underwent lumpectomy. Right breast partial mastectomy showed a single foci of low-grade DCIS in situ, 2.5 mm, completely excised.  Single separated focus of atypical ductal hyperplasia.  Background benign mammary parenchyma with intraductal papilloma, focally sclerosing, fibrocystic changes, and florid usual ductal hyperplasia.  Negative for malignancy and residual high-grade ductal carcinoma in situ.  Sentinel lymph node biopsy showed 4 lymph nodes all negative for malignancy.  Additional right breast excision showed benign mammary parenchyma with intraductal papilloma, associated florid usual ductal hyperplasia.  Negative for atypical perforated breast disease. pTis pN0, estrogen receptor 90% positive.  Patient has establish care with radiation oncology Dr. Baruch Gouty today.  There is plan for 4 weeks of adjuvant radiation . Review of Systems  Constitutional:  Negative for appetite change, chills, fatigue and fever.  HENT:   Negative for hearing loss and voice change.   Eyes:  Negative for eye problems.  Respiratory:   Negative for chest tightness and cough.   Cardiovascular:  Negative for chest pain.  Gastrointestinal:  Negative for abdominal distention, abdominal pain and blood in stool.  Endocrine: Negative for hot flashes.  Genitourinary:  Negative for difficulty urinating and frequency.   Musculoskeletal:  Negative for arthralgias.  Skin:  Negative for itching and rash.  Neurological:  Negative for extremity weakness.  Hematological:  Negative for adenopathy.  Psychiatric/Behavioral:  Negative for confusion.    MEDICAL HISTORY:  Past Medical History:  Diagnosis Date   Anemia    Colon polyp 71/05/2693   Complication of anesthesia    Ductal carcinoma in situ (DCIS) of right breast    GERD (gastroesophageal reflux disease)    Headache    HLD (hyperlipidemia)    Hypertension    IBS (irritable bowel syndrome)    Intraductal papilloma of breast 01/21/2012   left breast   Intraductal papilloma of breast, right    PONV (postoperative nausea and vomiting)    EXTREME NAUSEA    SURGICAL HISTORY: Past Surgical History:  Procedure Laterality Date   ABDOMINAL HYSTERECTOMY     BREAST BIOPSY Bilateral few yrs ago   benign-cyst   BREAST BIOPSY Right 1986   fibroadenoma   BREAST BIOPSY Right 05/17/2020   neg/ MRI bx 2 areas   BREAST BIOPSY Right 03/09/2022   rt breast stereo calcs x clip path pending   BREAST BIOPSY WITH RADIO FREQUENCY LOCALIZER Right 06/05/2020   Procedure: BREAST BIOPSY WITH RADIO FREQUENCY LOCALIZER;  Surgeon: Herbert Pun, MD;  Location: [facility];  Service: General;  Laterality: Right;   breast biposy Right 03/09/2022   u/s core bc heart clip path pending   BREAST EXCISIONAL BIOPSY Right 05/26/2020   neg   BREAST EXCISIONAL BIOPSY Left 2013   neg   CESAREAN SECTION  COLONOSCOPY     PARTIAL MASTECTOMY WITH AXILLARY SENTINEL LYMPH NODE BIOPSY Right 05/06/2022   Procedure: PARTIAL MASTECTOMY WITH AXILLARY SENTINEL LYMPH NODE BIOPSY With RF tag;  Surgeon:  Herbert Pun, MD;  Location: [facility];  Service: General;  Laterality: Right;    SOCIAL HISTORY: Social History   Socioeconomic History   Marital status: Divorced    Spouse name: Not on file   Number of children: Not on file   Years of education: Not on file   Highest education level: Not on file  Occupational History   Not on file  Tobacco Use   Smoking status: Never    Passive exposure: Never   Smokeless tobacco: Never  Vaping Use   Vaping Use: Never used  Substance and Sexual Activity   Alcohol use: Not Currently   Drug use: Never   Sexual activity: Not Currently  Other Topics Concern   Not on file  Social History Narrative   Not on file   Social Determinants of Health   Financial Resource Strain: Not on file  Food Insecurity: Not on file  Transportation Needs: Not on file  Physical Activity: Not on file  Stress: Not on file  Social Connections: Not on file  Intimate Partner Violence: Not on file    FAMILY HISTORY: Family History  Problem Relation Age of Onset   Diabetes Mellitus II Mother    Heart disease Mother    Prostate cancer Father 35       metastatic   Hypertension Father    CAD Father    Hypertension Sister    Hyperlipidemia Sister    CAD Brother    Diabetes Mellitus II Brother    Heart disease Brother    Kidney disease Brother     ALLERGIES:  is allergic to ace inhibitors, latex, povidone iodine, shellfish-derived products, and tape.  MEDICATIONS:  Current Outpatient Medications  Medication Sig Dispense Refill   Calcium Carbonate (CALTRATE 600 PO) Take 600 mg by mouth daily.     loratadine (CLARITIN) 10 MG tablet Take 10 mg by mouth at bedtime.     montelukast (SINGULAIR) 10 MG tablet Take 10 mg by mouth at bedtime.     Multiple Vitamins-Minerals (MULTIVITAMIN ADULT, MINERALS,) TABS Take 1 tablet by mouth at bedtime.     omeprazole (PRILOSEC) 20 MG capsule Take 20 mg by mouth daily as needed (Heartburn).     ondansetron  (ZOFRAN-ODT) 4 MG disintegrating tablet Take 1 tablet (4 mg total) by mouth every 8 (eight) hours as needed for nausea or vomiting. 10 tablet 0   simvastatin (ZOCOR) 20 MG tablet Take 20 mg by mouth at bedtime.     spironolactone (ALDACTONE) 100 MG tablet Take 100 mg by mouth daily.     triamcinolone (NASACORT) 55 MCG/ACT AERO nasal inhaler Place 2 sprays into the nose daily.      No current facility-administered medications for this visit.     PHYSICAL EXAMINATION: ECOG PERFORMANCE STATUS: 0 - Asymptomatic Vitals:   05/21/22 1353  BP: 127/85  Pulse: 74  Resp: 16  Temp: 98 F (36.7 C)  SpO2: 98%   Filed Weights   05/21/22 1353  Weight: 124 lb (56.2 kg)    Physical Exam Constitutional:      General: She is not in acute distress. HENT:     Head: Normocephalic and atraumatic.  Eyes:     General: No scleral icterus. Cardiovascular:     Rate and Rhythm: Normal rate and regular rhythm.  Heart sounds: Normal heart sounds.  Pulmonary:     Effort: Pulmonary effort is normal. No respiratory distress.     Breath sounds: No wheezing.  Abdominal:     General: Bowel sounds are normal. There is no distension.     Palpations: Abdomen is soft.  Musculoskeletal:        General: No deformity. Normal range of motion.     Cervical back: Normal range of motion and neck supple.  Skin:    General: Skin is warm and dry.     Findings: No erythema or rash.  Neurological:     Mental Status: She is alert and oriented to person, place, and time. Mental status is at baseline.     Cranial Nerves: No cranial nerve deficit.     Coordination: Coordination normal.  Psychiatric:        Mood and Affect: Mood normal.   Bilateral breast dense tissue.  No discrete palpable masses in both breasts.  No palpable axillary lymphadenopathy bilaterally.  LABORATORY DATA:  I have reviewed the data as listed Lab Results  Component Value Date   WBC 5.7 03/25/2022   HGB 12.9 03/25/2022   HCT 40.8  03/25/2022   MCV 90.1 03/25/2022   PLT 213 03/25/2022   Recent Labs    03/25/22 1046  NA 135  K 4.0  CL 105  CO2 26  GLUCOSE 93  BUN 18  CREATININE 0.86  CALCIUM 9.2  GFRNONAA >60  PROT 7.5  ALBUMIN 4.0  AST 25  ALT 22  ALKPHOS 41  BILITOT 1.3*    Iron/TIBC/Ferritin/ %Sat No results found for: IRON, TIBC, FERRITIN, IRONPCTSAT    RADIOGRAPHIC STUDIES: I have personally reviewed the radiological images as listed and agreed with the findings in the report. NM Sentinel Node Inj-No Rpt (Breast)  Result Date: 05/06/2022 Sulfur Colloid was injected by the Nuclear Medicine Technologist for sentinel lymph node localization.   MM BREAST SURGICAL SPECIMEN  Result Date: 05/06/2022 CLINICAL DATA:  Specimen radiograph EXAM: SPECIMEN RADIOGRAPH OF THE RIGHT BREAST COMPARISON:  None Available. FINDINGS: Status post excision of the right breast. The reflex and heart shaped clip are present within the specimen. IMPRESSION: Specimen radiograph of the right breast. Electronically Signed   By: Dorise Bullion III M.D.   On: 05/06/2022 15:54  MM Breast Surgical Specimen  Result Date: 05/06/2022 CLINICAL DATA:  Status post [facility] localized right breast lumpectomy. EXAM: SPECIMEN RADIOGRAPH OF THE RIGHT BREAST COMPARISON:  None Available. FINDINGS: Status post excision of the right breast. The reflector and X shaped clip are present within the specimen. A cylinder shaped clip and 2 dumbbell shaped clips are also identified within the specimen. The heart shaped clip and reflector in the medial breast are not within the specimen. IMPRESSION: Specimen radiograph of the right breast. Electronically Signed   By: Dorise Bullion III M.D.   On: 05/06/2022 15:13  US BREAST LTD UNI RIGHT INC AXILLA  Result Date: 04/30/2022 CLINICAL DATA:  Second-look ultrasound. Patient has biopsy-proven DCIS (X clip) and an additional papilloma within the RIGHT breast. Pre biopsy ultrasound of the axilla was  unremarkable. Subsequent MRI demonstrated non mass enhancement (biopsied at 2 sites with benign results on April 20th). Possible cortical thickening of a RIGHT axillary lymph node was noted on MRI performed March 20, 2022. EXAM: ULTRASOUND OF THE RIGHT BREAST COMPARISON:  Previous exams. FINDINGS: On physical exam, no suspicious adenopathy is appreciated. Targeted ultrasound was performed of the RIGHT axilla. No suspicious  RIGHT axillary lymph nodes are identified. Lymph nodes demonstrate normal echogenic hila and thin smooth cortices measuring 2-3 mm in thickness. As such, ultrasound-guided biopsy was canceled. IMPRESSION: No suspicious RIGHT axillary adenopathy. As such, biopsy was canceled. RECOMMENDATION: Recommend continued surgical and clinical management of biopsy proven RIGHT breast DCIS. I have discussed the findings and recommendations with the patient. If applicable, a reminder letter will be sent to the patient regarding the next appointment. BI-RADS CATEGORY  6: Known biopsy-proven malignancy. Electronically Signed   By: Valentino Saxon M.D.   On: 04/30/2022 08:57  MM RT RADIO FREQUENCY TAG LOC MAMMO GUIDE  Result Date: 05/05/2022 CLINICAL DATA:  The patient presents for RF tag localization of 2 sites in the RIGHT breast. Patient has had multiple recent biopsies. Stereotactic biopsy of calcifications in the UPPER-OUTER QUADRANT showed ductal carcinoma in situ and was marked with a X shaped clip. Ultrasound-guided core biopsy of intraductal mass in the 3 o'clock location of the RIGHT breast showed papilloma and was marked with a heart shaped clip. EXAM: MAMMOGRAPHIC GUIDED RADIOFREQUENCY DEVICE LOCALIZATION OF THE RIGHT BREAST x2 COMPARISON:  Prior studies. FINDINGS: Patient presents for radiofrequency device localization prior to excision. I met with the patient and we discussed the procedure of radiofrequency device localization including benefits and alternatives. We discussed the high  likelihood of a successful procedure. We discussed the risks of the procedure including infection, bleeding, tissue injury and further surgery. Informed, written consent was given. The usual time-out protocol was performed immediately prior to the procedure. Site 1: Using mammographic guidance, sterile technique, 1% lidocaine as local anesthesia, a radiofrequency tag number [id number] was used to localize heart shaped clip using a MEDIAL approach. The follow-up mammogram images confirm that the RF device is in the expected location and are marked for Dr. Windell Moment. Site 2: Using mammographic guidance, sterile technique, 1% lidocaine as local anesthesia, a radiofrequency tag number [id number] was used to localize the X shaped clip using a craniocaudal approach. The follow-up mammogram images confirm that the RF device is in the expected location and are marked for Dr. Windell Moment. The patient tolerated the procedure well and was released from the Center. IMPRESSION: Radiofrequency device localization of the heart shaped an X shaped clips in the RIGHT breast. No apparent complications. Electronically Signed   By: Nolon Nations M.D.   On: 05/05/2022 16:37  MM RT RADIO FREQUENCY TAG EA ADD LESION LOC MAMMO GUIDE  Result Date: 05/05/2022 CLINICAL DATA:  The patient presents for RF tag localization of 2 sites in the RIGHT breast. Patient has had multiple recent biopsies. Stereotactic biopsy of calcifications in the UPPER-OUTER QUADRANT showed ductal carcinoma in situ and was marked with a X shaped clip. Ultrasound-guided core biopsy of intraductal mass in the 3 o'clock location of the RIGHT breast showed papilloma and was marked with a heart shaped clip. EXAM: MAMMOGRAPHIC GUIDED RADIOFREQUENCY DEVICE LOCALIZATION OF THE RIGHT BREAST x2 COMPARISON:  Prior studies. FINDINGS: Patient presents for radiofrequency device localization prior to excision. I met with the patient and we discussed the procedure of radiofrequency  device localization including benefits and alternatives. We discussed the high likelihood of a successful procedure. We discussed the risks of the procedure including infection, bleeding, tissue injury and further surgery. Informed, written consent was given. The usual time-out protocol was performed immediately prior to the procedure. Site 1: Using mammographic guidance, sterile technique, 1% lidocaine as local anesthesia, a radiofrequency tag number [id number] was used to localize heart shaped clip using  a MEDIAL approach. The follow-up mammogram images confirm that the RF device is in the expected location and are marked for Dr. Windell Moment. Site 2: Using mammographic guidance, sterile technique, 1% lidocaine as local anesthesia, a radiofrequency tag number [id number] was used to localize the X shaped clip using a craniocaudal approach. The follow-up mammogram images confirm that the RF device is in the expected location and are marked for Dr. Windell Moment. The patient tolerated the procedure well and was released from the Center. IMPRESSION: Radiofrequency device localization of the heart shaped an X shaped clips in the RIGHT breast. No apparent complications. Electronically Signed   By: Nolon Nations M.D.   On: 05/05/2022 16:37     ASSESSMENT & PLAN:  1. Ductal carcinoma in situ (DCIS) of right breast   2. History of hormone replacement therapy   3. Genetic testing    Cancer Staging  Ductal carcinoma in situ (DCIS) of right breast Staging form: Breast, AJCC 8th Edition - Pathologic stage from 05/21/2022: Stage 0 (pTis (DCIS), pN0, cM0, G1, ER+, PR: Not Assessed, HER2: Not Assessed) - Signed by Earlie Server, MD on 05/21/2022   #Right breast DCIS, ER positive. pTis pN0 Pathology results were reviewed and discussed with patient. Recommend adjuvant radiation. We discussed about rationale of adjuvant endocrine therapy Patient has history of hysterectomy.  We will check [place], LH check at next visit to determine  menopausal state. Recommend patient to avoid estrogen containing supplements.  #Family history of cancer, Patient has establish care with genetic counselor and had [employer] done.  No pathological mutations.she has PALB2 VUS.   Orders Placed This Encounter  Procedures   CBC with Differential/Platelet    Standing Status:   Future    Standing Expiration Date:   05/22/2023   Comprehensive metabolic panel    Standing Status:   Future    Standing Expiration Date:   05/21/2023   Estradiol    Standing Status:   Future    Standing Expiration Date:   05/22/2023   Luteinizing hormone    Standing Status:   Future    Standing Expiration Date:   9/92/3414   Follicle stimulating hormone    Standing Status:   Future    Standing Expiration Date:   05/21/2023    All questions were answered. The patient knows to call the clinic with any problems questions or concerns.  cc Leonel Ramsay, MD    Return of visit: 2 weeks after finishing radiation Thank you for this kind referral and the opportunity to participate in the care of this patient. A copy of today's note is routed to referring provider   Earlie Server, MD, PhD [facility] Hematology Oncology 05/21/2022

## 2022-05-21 NOTE — Consult Note (Signed)
NEW PATIENT EVALUATION  Name: Jacqueline Murray  MRN: [id number]  Date:   05/21/2022     DOB: [birth year]   This 56 y.o. 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  REFERRING PHYSICIAN: Leonel Ramsay, MD  CHIEF COMPLAINT:  Chief Complaint  Patient presents with   Breast Cancer    DIAGNOSIS: The encounter diagnosis was Ductal carcinoma in situ (DCIS) of right breast.   PREVIOUS INVESTIGATIONS:  Mammograms MRI of breast and ultrasound reviewed Pathology reports reviewed Clinical notes reviewed  HPI: Patient is a 56 year old female who has been followed for development of coarse heterogeneous calcifications in the upper outer quadrant of the right breast spanning an area of 5 mm.  Left breast was fine.  She had targeted ultrasound at the 3 o'clock position 3 cm from the nipple showing showing an intraductal mass.  Breast MRI showed a large area of non-mass enhancement within the superior central right breast adjacent to the marking clip potentially representing additional DCIS.  She also had multiple enhancing masses throughout the right and left breast.  Targeted biopsies of these areas at the 3 o'clock position 3 cm from the nipple showed fragments of intraductal papilloma with ductal hyperplasia.  Stereotactic core biopsy of the upper outer quadrant of the right breast showed high-grade ductal carcinoma in situ with comedonecrosis.  She underwent a wide local excision of the right upper outer quadrant showing low-grade ductal carcinoma in situ 2.5 mm completely excised with margins greater than 5 mm.  4 lymph nodes were removed all negative for malignancy.  She also had excision of an intraductal papilloma.  Tumor was ER positive.  She is seen today for radiation collagen consultation she is doing well.  She specifically denies breast tenderness cough or bone  pain.  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    PAST SURGICAL HISTORY:  Past Surgical History:  Procedure Laterality Date   ABDOMINAL HYSTERECTOMY     BREAST BIOPSY Bilateral few yrs ago   benign-cyst   BREAST BIOPSY Right 1986   fibroadenoma   BREAST BIOPSY Right 05/17/2020   neg/ MRI bx 2 areas   BREAST BIOPSY Right 03/09/2022   rt breast stereo calcs x clip path pending   BREAST BIOPSY WITH RADIO FREQUENCY LOCALIZER Right 06/05/2020   Procedure: BREAST BIOPSY WITH RADIO FREQUENCY LOCALIZER;  Surgeon: Herbert Pun, MD;  Location: [facility] ORS;  Service: General;  Laterality: Right;   breast biposy Right 03/09/2022   u/s core bc heart clip path pending   BREAST EXCISIONAL BIOPSY Right 05/26/2020   neg   BREAST EXCISIONAL BIOPSY Left 2013   neg   CESAREAN SECTION     COLONOSCOPY     PARTIAL MASTECTOMY WITH AXILLARY SENTINEL LYMPH NODE BIOPSY Right 05/06/2022   Procedure: PARTIAL MASTECTOMY WITH AXILLARY SENTINEL LYMPH NODE BIOPSY With RF tag;  Surgeon: Herbert Pun, MD;  Location: [facility] OR[facility];  Service: General;  Laterality: Right;    FAMILY HISTORY: family history includes CAD in her brother and father; Diabetes Mellitus II in her brother  and mother; Heart disease in her brother and mother; Hyperlipidemia in her sister; Hypertension in her father and sister; Kidney disease in her brother; Prostate cancer (age of onset: 60) in her father.  SOCIAL HISTORY:  reports that she has never smoked. She has never been exposed to tobacco  smoke. She has never used smokeless tobacco. She reports that she does not currently use alcohol. She reports that she does not use drugs.  ALLERGIES: Ace inhibitors, Latex, Povidone iodine, Shellfish-derived products, and Tape  MEDICATIONS:  Current Outpatient Medications  Medication Sig Dispense Refill   Calcium Carbonate (CALTRATE 600 PO) Take 600 mg by mouth daily.     loratadine (CLARITIN) 10 MG tablet Take 10 mg by mouth at bedtime.     montelukast (SINGULAIR) 10 MG tablet Take 10 mg by mouth at bedtime.     Multiple Vitamins-Minerals (MULTIVITAMIN ADULT, MINERALS,) TABS Take 1 tablet by mouth at bedtime.     omeprazole (PRILOSEC) 20 MG capsule Take 20 mg by mouth daily as needed (Heartburn).     ondansetron (ZOFRAN-ODT) 4 MG disintegrating tablet Take 1 tablet (4 mg total) by mouth every 8 (eight) hours as needed for nausea or vomiting. 10 tablet 0   simvastatin (ZOCOR) 20 MG tablet Take 20 mg by mouth at bedtime.     spironolactone (ALDACTONE) 100 MG tablet Take 100 mg by mouth daily.     triamcinolone (NASACORT) 55 MCG/ACT AERO nasal inhaler Place 2 sprays into the nose daily.      No current facility-administered medications for this encounter.    ECOG PERFORMANCE STATUS:  0 - Asymptomatic  REVIEW OF SYSTEMS: Patient denies any weight loss, fatigue, weakness, fever, chills or night sweats. Patient denies any loss of vision, blurred vision. Patient denies any ringing  of the ears or hearing loss. No irregular heartbeat. Patient denies heart murmur or history of fainting. Patient denies any chest pain or pain radiating to her upper extremities. Patient denies any shortness of breath, difficulty breathing at night, cough or hemoptysis. Patient denies any swelling in the lower legs. Patient denies any nausea vomiting, vomiting of blood, or coffee ground material in the vomitus. Patient denies any stomach pain. Patient states has had normal bowel movements no significant constipation or  diarrhea. Patient denies any dysuria, hematuria or significant nocturia. Patient denies any problems walking, swelling in the joints or loss of balance. Patient denies any skin changes, loss of hair or loss of weight. Patient denies any excessive worrying or anxiety or significant depression. Patient denies any problems with insomnia. Patient denies excessive thirst, polyuria, polydipsia. Patient denies any swollen glands, patient denies easy bruising or easy bleeding. Patient denies any recent infections, allergies or URI. Patient "s visual fields have not changed significantly in recent time.   PHYSICAL EXAM: BP 127/85 (BP Location: Left Arm, Patient Position: Sitting, Cuff Size: Normal)   Pulse 74   Temp 98 F (36.7 C) (Tympanic)   Resp 16   Wt 124 lb 9.6 oz (56.5 kg)   BMI 21.39 kg/m  Right breast is wide local excision scar around the nipple areolar complex.  No dominant masses noted in either breast.  No axillary or supraclavicular adenopathy is appreciated.  Well-developed well-nourished patient in NAD. HEENT reveals PERLA, EOMI, discs not visualized.  Oral cavity is clear. No oral mucosal lesions are identified. Neck is clear without evidence of cervical or supraclavicular adenopathy. Lungs are clear to A&P. Cardiac examination is essentially unremarkable with regular rate and rhythm without  murmur rub or thrill. Abdomen is benign with no organomegaly or masses noted. Motor sensory and DTR levels are equal and symmetric in the upper and lower extremities. Cranial nerves II through XII are grossly intact. Proprioception is intact. No peripheral adenopathy or edema is identified. No motor or sensory levels are noted. Crude visual fields are within normal range.  LABORATORY DATA: Pathology reports reviewed    RADIOLOGY RESULTS: Mammograms ultrasound and MRI scans all reviewed compatible with above-stated findings   IMPRESSION: Ductal carcinoma site to ER positive the right breast status post  wide local excision in 56 year old female  PLAN: At this time I have recommended hypofractionated course of whole breast radiation over 3 weeks.  We will also boost her scar another 1000 cGy using electron beam.  Risks and benefits of treatment occluding skin reaction fatigue alteration of blood counts possible inclusion of superficial lung all were reviewed in detail with the patient.  Patient comprehends my recommendations well I personally set up a simulation for next week.  She will also benefit from antiestrogen therapy after completion of radiation.  I would like to take this opportunity to thank you for allowing me to participate in the care of your patient.Noreene Filbert, MD

## 2022-05-27 ENCOUNTER — Ambulatory Visit
Admission: RE | Admit: 2022-05-27 | Discharge: 2022-05-27 | Disposition: A | Discharge status: Home / Self Care | Payer: BC Managed Care – PPO | Source: Ambulatory Visit | Attending: Radiation Oncology | Admitting: Radiation Oncology

## 2022-05-27 DIAGNOSIS — Z510 Encounter for antineoplastic radiation therapy: Secondary | ICD-10-CM | POA: Insufficient documentation

## 2022-05-27 DIAGNOSIS — D0511 Intraductal carcinoma in situ of right breast: Secondary | ICD-10-CM | POA: Insufficient documentation

## 2022-05-29 ENCOUNTER — Other Ambulatory Visit: Payer: Self-pay | Admitting: *Deleted

## 2022-05-29 DIAGNOSIS — D0511 Intraductal carcinoma in situ of right breast: Secondary | ICD-10-CM

## 2022-06-01 DIAGNOSIS — Z510 Encounter for antineoplastic radiation therapy: Secondary | ICD-10-CM | POA: Diagnosis not present

## 2022-06-03 ENCOUNTER — Ambulatory Visit: Admission: RE | Admit: 2022-06-03 | Payer: BC Managed Care – PPO | Source: Ambulatory Visit

## 2022-06-03 DIAGNOSIS — Z510 Encounter for antineoplastic radiation therapy: Secondary | ICD-10-CM | POA: Diagnosis not present

## 2022-06-04 ENCOUNTER — Ambulatory Visit
Admission: RE | Admit: 2022-06-04 | Discharge: 2022-06-04 | Disposition: A | Discharge status: Home / Self Care | Payer: BC Managed Care – PPO | Source: Ambulatory Visit | Attending: Radiation Oncology | Admitting: Radiation Oncology

## 2022-06-04 ENCOUNTER — Other Ambulatory Visit: Payer: Self-pay

## 2022-06-04 ENCOUNTER — Encounter: Payer: Self-pay | Admitting: Oncology

## 2022-06-04 DIAGNOSIS — Z510 Encounter for antineoplastic radiation therapy: Secondary | ICD-10-CM | POA: Diagnosis not present

## 2022-06-04 LAB — RAD ONC ARIA SESSION SUMMARY
Course Elapsed Days: 0
Plan Fractions Treated to Date: 1
Plan Prescribed Dose Per Fraction: 2.66 Gy
Plan Total Fractions Prescribed: 16
Plan Total Prescribed Dose: 42.56 Gy
Reference Point Dosage Given to Date: 2.66 Gy
Reference Point Session Dosage Given: 2.66 Gy
Session Number: 1

## 2022-06-05 ENCOUNTER — Ambulatory Visit
Admission: RE | Admit: 2022-06-05 | Discharge: 2022-06-05 | Disposition: A | Discharge status: Home / Self Care | Payer: BC Managed Care – PPO | Source: Ambulatory Visit | Attending: Radiation Oncology | Admitting: Radiation Oncology

## 2022-06-05 ENCOUNTER — Other Ambulatory Visit: Payer: Self-pay

## 2022-06-05 DIAGNOSIS — Z510 Encounter for antineoplastic radiation therapy: Secondary | ICD-10-CM | POA: Diagnosis not present

## 2022-06-05 LAB — RAD ONC ARIA SESSION SUMMARY
Course Elapsed Days: 1
Plan Fractions Treated to Date: 2
Plan Prescribed Dose Per Fraction: 2.66 Gy
Plan Total Fractions Prescribed: 16
Plan Total Prescribed Dose: 42.56 Gy
Reference Point Dosage Given to Date: 5.32 Gy
Reference Point Session Dosage Given: 2.66 Gy
Session Number: 2

## 2022-06-05 NOTE — Telephone Encounter (Signed)
Spoke to pt and she is wanting Intermittent FMLA since she is getting radiation. This due to side effects from radiation. Will you guys complete and fax to [place] please.

## 2022-06-08 ENCOUNTER — Ambulatory Visit
Admission: RE | Admit: 2022-06-08 | Discharge: 2022-06-08 | Disposition: A | Discharge status: Home / Self Care | Payer: BC Managed Care – PPO | Source: Ambulatory Visit | Attending: Radiation Oncology | Admitting: Radiation Oncology

## 2022-06-08 ENCOUNTER — Other Ambulatory Visit: Payer: Self-pay

## 2022-06-08 DIAGNOSIS — Z510 Encounter for antineoplastic radiation therapy: Secondary | ICD-10-CM | POA: Diagnosis not present

## 2022-06-08 LAB — RAD ONC ARIA SESSION SUMMARY
Course Elapsed Days: 4
Plan Fractions Treated to Date: 3
Plan Prescribed Dose Per Fraction: 2.66 Gy
Plan Total Fractions Prescribed: 16
Plan Total Prescribed Dose: 42.56 Gy
Reference Point Dosage Given to Date: 7.98 Gy
Reference Point Session Dosage Given: 2.66 Gy
Session Number: 3

## 2022-06-09 ENCOUNTER — Other Ambulatory Visit: Payer: Self-pay

## 2022-06-09 ENCOUNTER — Ambulatory Visit
Admission: RE | Admit: 2022-06-09 | Discharge: 2022-06-09 | Disposition: A | Discharge status: Home / Self Care | Payer: BC Managed Care – PPO | Source: Ambulatory Visit | Attending: Radiation Oncology | Admitting: Radiation Oncology

## 2022-06-09 DIAGNOSIS — Z510 Encounter for antineoplastic radiation therapy: Secondary | ICD-10-CM | POA: Diagnosis not present

## 2022-06-09 LAB — RAD ONC ARIA SESSION SUMMARY
Course Elapsed Days: 5
Plan Fractions Treated to Date: 4
Plan Prescribed Dose Per Fraction: 2.66 Gy
Plan Total Fractions Prescribed: 16
Plan Total Prescribed Dose: 42.56 Gy
Reference Point Dosage Given to Date: 10.64 Gy
Reference Point Session Dosage Given: 2.66 Gy
Session Number: 4

## 2022-06-10 ENCOUNTER — Ambulatory Visit
Admission: RE | Admit: 2022-06-10 | Discharge: 2022-06-10 | Disposition: A | Discharge status: Home / Self Care | Payer: BC Managed Care – PPO | Source: Ambulatory Visit | Attending: Radiation Oncology | Admitting: Radiation Oncology

## 2022-06-10 ENCOUNTER — Other Ambulatory Visit: Payer: Self-pay

## 2022-06-10 DIAGNOSIS — Z510 Encounter for antineoplastic radiation therapy: Secondary | ICD-10-CM | POA: Diagnosis not present

## 2022-06-10 LAB — RAD ONC ARIA SESSION SUMMARY
Course Elapsed Days: 6
Plan Fractions Treated to Date: 5
Plan Prescribed Dose Per Fraction: 2.66 Gy
Plan Total Fractions Prescribed: 16
Plan Total Prescribed Dose: 42.56 Gy
Reference Point Dosage Given to Date: 13.3 Gy
Reference Point Session Dosage Given: 2.66 Gy
Session Number: 5

## 2022-06-11 ENCOUNTER — Other Ambulatory Visit: Payer: Self-pay

## 2022-06-11 ENCOUNTER — Ambulatory Visit
Admission: RE | Admit: 2022-06-11 | Discharge: 2022-06-11 | Disposition: A | Discharge status: Home / Self Care | Payer: BC Managed Care – PPO | Source: Ambulatory Visit | Attending: Radiation Oncology | Admitting: Radiation Oncology

## 2022-06-11 DIAGNOSIS — Z510 Encounter for antineoplastic radiation therapy: Secondary | ICD-10-CM | POA: Diagnosis not present

## 2022-06-11 LAB — RAD ONC ARIA SESSION SUMMARY
Course Elapsed Days: 7
Plan Fractions Treated to Date: 6
Plan Prescribed Dose Per Fraction: 2.66 Gy
Plan Total Fractions Prescribed: 16
Plan Total Prescribed Dose: 42.56 Gy
Reference Point Dosage Given to Date: 15.96 Gy
Reference Point Session Dosage Given: 2.66 Gy
Session Number: 6

## 2022-06-12 ENCOUNTER — Ambulatory Visit
Admission: RE | Admit: 2022-06-12 | Discharge: 2022-06-12 | Disposition: A | Discharge status: Home / Self Care | Payer: BC Managed Care – PPO | Source: Ambulatory Visit | Attending: Radiation Oncology | Admitting: Radiation Oncology

## 2022-06-12 ENCOUNTER — Other Ambulatory Visit: Payer: Self-pay

## 2022-06-12 DIAGNOSIS — Z510 Encounter for antineoplastic radiation therapy: Secondary | ICD-10-CM | POA: Diagnosis not present

## 2022-06-12 LAB — RAD ONC ARIA SESSION SUMMARY
Course Elapsed Days: 8
Plan Fractions Treated to Date: 7
Plan Prescribed Dose Per Fraction: 2.66 Gy
Plan Total Fractions Prescribed: 16
Plan Total Prescribed Dose: 42.56 Gy
Reference Point Dosage Given to Date: 18.62 Gy
Reference Point Session Dosage Given: 2.66 Gy
Session Number: 7

## 2022-06-15 ENCOUNTER — Other Ambulatory Visit: Payer: Self-pay

## 2022-06-15 ENCOUNTER — Ambulatory Visit
Admission: RE | Admit: 2022-06-15 | Discharge: 2022-06-15 | Disposition: A | Discharge status: Home / Self Care | Payer: BC Managed Care – PPO | Source: Ambulatory Visit | Attending: Radiation Oncology | Admitting: Radiation Oncology

## 2022-06-15 DIAGNOSIS — Z510 Encounter for antineoplastic radiation therapy: Secondary | ICD-10-CM | POA: Diagnosis not present

## 2022-06-15 LAB — RAD ONC ARIA SESSION SUMMARY
Course Elapsed Days: 11
Plan Fractions Treated to Date: 8
Plan Prescribed Dose Per Fraction: 2.66 Gy
Plan Total Fractions Prescribed: 16
Plan Total Prescribed Dose: 42.56 Gy
Reference Point Dosage Given to Date: 21.28 Gy
Reference Point Session Dosage Given: 2.66 Gy
Session Number: 8

## 2022-06-16 ENCOUNTER — Other Ambulatory Visit: Payer: Self-pay

## 2022-06-16 ENCOUNTER — Ambulatory Visit
Admission: RE | Admit: 2022-06-16 | Discharge: 2022-06-16 | Disposition: A | Discharge status: Home / Self Care | Payer: BC Managed Care – PPO | Source: Ambulatory Visit | Attending: Radiation Oncology | Admitting: Radiation Oncology

## 2022-06-16 DIAGNOSIS — Z510 Encounter for antineoplastic radiation therapy: Secondary | ICD-10-CM | POA: Diagnosis not present

## 2022-06-16 LAB — RAD ONC ARIA SESSION SUMMARY
Course Elapsed Days: 12
Plan Fractions Treated to Date: 9
Plan Prescribed Dose Per Fraction: 2.66 Gy
Plan Total Fractions Prescribed: 16
Plan Total Prescribed Dose: 42.56 Gy
Reference Point Dosage Given to Date: 23.94 Gy
Reference Point Session Dosage Given: 2.66 Gy
Session Number: 9

## 2022-06-17 ENCOUNTER — Ambulatory Visit
Admission: RE | Admit: 2022-06-17 | Discharge: 2022-06-17 | Disposition: A | Discharge status: Home / Self Care | Payer: BC Managed Care – PPO | Source: Ambulatory Visit | Attending: Radiation Oncology | Admitting: Radiation Oncology

## 2022-06-17 ENCOUNTER — Other Ambulatory Visit: Payer: Self-pay

## 2022-06-17 DIAGNOSIS — Z510 Encounter for antineoplastic radiation therapy: Secondary | ICD-10-CM | POA: Diagnosis not present

## 2022-06-17 LAB — RAD ONC ARIA SESSION SUMMARY
Course Elapsed Days: 13
Plan Fractions Treated to Date: 10
Plan Prescribed Dose Per Fraction: 2.66 Gy
Plan Total Fractions Prescribed: 16
Plan Total Prescribed Dose: 42.56 Gy
Reference Point Dosage Given to Date: 26.6 Gy
Reference Point Session Dosage Given: 2.66 Gy
Session Number: 10

## 2022-06-18 ENCOUNTER — Inpatient Hospital Stay: Payer: BC Managed Care – PPO

## 2022-06-18 ENCOUNTER — Other Ambulatory Visit: Payer: Self-pay

## 2022-06-18 ENCOUNTER — Ambulatory Visit
Admission: RE | Admit: 2022-06-18 | Discharge: 2022-06-18 | Disposition: A | Discharge status: Home / Self Care | Payer: BC Managed Care – PPO | Source: Ambulatory Visit | Attending: Radiation Oncology | Admitting: Radiation Oncology

## 2022-06-18 DIAGNOSIS — D0511 Intraductal carcinoma in situ of right breast: Secondary | ICD-10-CM

## 2022-06-18 DIAGNOSIS — Z510 Encounter for antineoplastic radiation therapy: Secondary | ICD-10-CM | POA: Diagnosis not present

## 2022-06-18 LAB — CBC
HCT: 38 % (ref 36.0–46.0)
Hemoglobin: 12.3 g/dL (ref 12.0–15.0)
MCH: 28.9 pg (ref 26.0–34.0)
MCHC: 32.4 g/dL (ref 30.0–36.0)
MCV: 89.2 fL (ref 80.0–100.0)
Platelets: 200 10*3/uL (ref 150–400)
RBC: 4.26 MIL/uL (ref 3.87–5.11)
RDW: 13 % (ref 11.5–15.5)
WBC: 6 10*3/uL (ref 4.0–10.5)
nRBC: 0 % (ref 0.0–0.2)

## 2022-06-18 LAB — RAD ONC ARIA SESSION SUMMARY
Course Elapsed Days: 14
Plan Fractions Treated to Date: 11
Plan Prescribed Dose Per Fraction: 2.66 Gy
Plan Total Fractions Prescribed: 16
Plan Total Prescribed Dose: 42.56 Gy
Reference Point Dosage Given to Date: 29.26 Gy
Reference Point Session Dosage Given: 2.66 Gy
Session Number: 11

## 2022-06-19 ENCOUNTER — Ambulatory Visit
Admission: RE | Admit: 2022-06-19 | Discharge: 2022-06-19 | Disposition: A | Discharge status: Home / Self Care | Payer: BC Managed Care – PPO | Source: Ambulatory Visit | Attending: Radiation Oncology | Admitting: Radiation Oncology

## 2022-06-19 ENCOUNTER — Other Ambulatory Visit: Payer: Self-pay

## 2022-06-19 DIAGNOSIS — Z510 Encounter for antineoplastic radiation therapy: Secondary | ICD-10-CM | POA: Diagnosis not present

## 2022-06-19 LAB — RAD ONC ARIA SESSION SUMMARY
Course Elapsed Days: 15
Plan Fractions Treated to Date: 12
Plan Prescribed Dose Per Fraction: 2.66 Gy
Plan Total Fractions Prescribed: 16
Plan Total Prescribed Dose: 42.56 Gy
Reference Point Dosage Given to Date: 31.92 Gy
Reference Point Session Dosage Given: 2.66 Gy
Session Number: 12

## 2022-06-22 ENCOUNTER — Other Ambulatory Visit: Payer: Self-pay

## 2022-06-22 ENCOUNTER — Ambulatory Visit
Admission: RE | Admit: 2022-06-22 | Discharge: 2022-06-22 | Disposition: A | Discharge status: Home / Self Care | Payer: BC Managed Care – PPO | Source: Ambulatory Visit | Attending: Radiation Oncology | Admitting: Radiation Oncology

## 2022-06-22 DIAGNOSIS — D0511 Intraductal carcinoma in situ of right breast: Secondary | ICD-10-CM | POA: Insufficient documentation

## 2022-06-22 DIAGNOSIS — Z510 Encounter for antineoplastic radiation therapy: Secondary | ICD-10-CM | POA: Insufficient documentation

## 2022-06-22 LAB — RAD ONC ARIA SESSION SUMMARY
Course Elapsed Days: 18
Plan Fractions Treated to Date: 13
Plan Prescribed Dose Per Fraction: 2.66 Gy
Plan Total Fractions Prescribed: 16
Plan Total Prescribed Dose: 42.56 Gy
Reference Point Dosage Given to Date: 34.58 Gy
Reference Point Session Dosage Given: 2.66 Gy
Session Number: 13

## 2022-06-24 ENCOUNTER — Ambulatory Visit
Admission: RE | Admit: 2022-06-24 | Discharge: 2022-06-24 | Disposition: A | Discharge status: Home / Self Care | Payer: BC Managed Care – PPO | Source: Ambulatory Visit | Attending: Radiation Oncology | Admitting: Radiation Oncology

## 2022-06-24 ENCOUNTER — Other Ambulatory Visit: Payer: Self-pay

## 2022-06-24 DIAGNOSIS — Z510 Encounter for antineoplastic radiation therapy: Secondary | ICD-10-CM | POA: Diagnosis not present

## 2022-06-24 LAB — RAD ONC ARIA SESSION SUMMARY
Course Elapsed Days: 20
Plan Fractions Treated to Date: 14
Plan Prescribed Dose Per Fraction: 2.66 Gy
Plan Total Fractions Prescribed: 16
Plan Total Prescribed Dose: 42.56 Gy
Reference Point Dosage Given to Date: 37.24 Gy
Reference Point Session Dosage Given: 2.66 Gy
Session Number: 14

## 2022-06-25 ENCOUNTER — Other Ambulatory Visit: Payer: Self-pay

## 2022-06-25 ENCOUNTER — Ambulatory Visit
Admission: RE | Admit: 2022-06-25 | Discharge: 2022-06-25 | Disposition: A | Discharge status: Home / Self Care | Payer: BC Managed Care – PPO | Source: Ambulatory Visit | Attending: Radiation Oncology | Admitting: Radiation Oncology

## 2022-06-25 DIAGNOSIS — Z510 Encounter for antineoplastic radiation therapy: Secondary | ICD-10-CM | POA: Diagnosis not present

## 2022-06-25 LAB — RAD ONC ARIA SESSION SUMMARY
Course Elapsed Days: 21
Plan Fractions Treated to Date: 15
Plan Prescribed Dose Per Fraction: 2.66 Gy
Plan Total Fractions Prescribed: 16
Plan Total Prescribed Dose: 42.56 Gy
Reference Point Dosage Given to Date: 39.9 Gy
Reference Point Session Dosage Given: 2.66 Gy
Session Number: 15

## 2022-06-26 ENCOUNTER — Other Ambulatory Visit: Payer: Self-pay

## 2022-06-26 ENCOUNTER — Ambulatory Visit
Admission: RE | Admit: 2022-06-26 | Discharge: 2022-06-26 | Disposition: A | Discharge status: Home / Self Care | Payer: BC Managed Care – PPO | Source: Ambulatory Visit | Attending: Radiation Oncology | Admitting: Radiation Oncology

## 2022-06-26 DIAGNOSIS — Z510 Encounter for antineoplastic radiation therapy: Secondary | ICD-10-CM | POA: Diagnosis not present

## 2022-06-26 LAB — RAD ONC ARIA SESSION SUMMARY
Course Elapsed Days: 22
Plan Fractions Treated to Date: 16
Plan Prescribed Dose Per Fraction: 2.66 Gy
Plan Total Fractions Prescribed: 16
Plan Total Prescribed Dose: 42.56 Gy
Reference Point Dosage Given to Date: 42.56 Gy
Reference Point Session Dosage Given: 2.66 Gy
Session Number: 16

## 2022-06-29 ENCOUNTER — Inpatient Hospital Stay: Payer: BC Managed Care – PPO

## 2022-06-29 ENCOUNTER — Other Ambulatory Visit: Payer: Self-pay

## 2022-06-29 ENCOUNTER — Other Ambulatory Visit: Payer: BC Managed Care – PPO

## 2022-06-29 ENCOUNTER — Ambulatory Visit
Admission: RE | Admit: 2022-06-29 | Discharge: 2022-06-29 | Disposition: A | Discharge status: Home / Self Care | Payer: BC Managed Care – PPO | Source: Ambulatory Visit | Attending: Radiation Oncology | Admitting: Radiation Oncology

## 2022-06-29 DIAGNOSIS — Z510 Encounter for antineoplastic radiation therapy: Secondary | ICD-10-CM | POA: Diagnosis not present

## 2022-06-29 LAB — RAD ONC ARIA SESSION SUMMARY
Course Elapsed Days: 25
Plan Fractions Treated to Date: 1
Plan Prescribed Dose Per Fraction: 2 Gy
Plan Total Fractions Prescribed: 5
Plan Total Prescribed Dose: 10 Gy
Reference Point Dosage Given to Date: 44.56 Gy
Reference Point Session Dosage Given: 2 Gy
Session Number: 17

## 2022-06-30 ENCOUNTER — Ambulatory Visit
Admission: RE | Admit: 2022-06-30 | Discharge: 2022-06-30 | Disposition: A | Discharge status: Home / Self Care | Payer: BC Managed Care – PPO | Source: Ambulatory Visit | Attending: Radiation Oncology | Admitting: Radiation Oncology

## 2022-06-30 ENCOUNTER — Other Ambulatory Visit: Payer: Self-pay

## 2022-06-30 DIAGNOSIS — Z510 Encounter for antineoplastic radiation therapy: Secondary | ICD-10-CM | POA: Diagnosis not present

## 2022-06-30 LAB — RAD ONC ARIA SESSION SUMMARY
Course Elapsed Days: 26
Plan Fractions Treated to Date: 2
Plan Prescribed Dose Per Fraction: 2 Gy
Plan Total Fractions Prescribed: 5
Plan Total Prescribed Dose: 10 Gy
Reference Point Dosage Given to Date: 46.56 Gy
Reference Point Session Dosage Given: 2 Gy
Session Number: 18

## 2022-07-01 ENCOUNTER — Inpatient Hospital Stay: Payer: BC Managed Care – PPO | Attending: Oncology

## 2022-07-01 ENCOUNTER — Other Ambulatory Visit: Payer: Self-pay

## 2022-07-01 ENCOUNTER — Ambulatory Visit
Admission: RE | Admit: 2022-07-01 | Discharge: 2022-07-01 | Disposition: A | Discharge status: Home / Self Care | Payer: BC Managed Care – PPO | Source: Ambulatory Visit | Attending: Radiation Oncology | Admitting: Radiation Oncology

## 2022-07-01 DIAGNOSIS — Z510 Encounter for antineoplastic radiation therapy: Secondary | ICD-10-CM | POA: Diagnosis not present

## 2022-07-01 DIAGNOSIS — D0511 Intraductal carcinoma in situ of right breast: Secondary | ICD-10-CM | POA: Diagnosis present

## 2022-07-01 DIAGNOSIS — M8580 Other specified disorders of bone density and structure, unspecified site: Secondary | ICD-10-CM | POA: Insufficient documentation

## 2022-07-01 LAB — COMPREHENSIVE METABOLIC PANEL
ALT: 45 U/L — ABNORMAL HIGH (ref 0–44)
AST: 37 U/L (ref 15–41)
Albumin: 4.3 g/dL (ref 3.5–5.0)
Alkaline Phosphatase: 44 U/L (ref 38–126)
Anion gap: 4 — ABNORMAL LOW (ref 5–15)
BUN: 15 mg/dL (ref 6–20)
CO2: 23 mmol/L (ref 22–32)
Calcium: 9.3 mg/dL (ref 8.9–10.3)
Chloride: 110 mmol/L (ref 98–111)
Creatinine, Ser: 0.88 mg/dL (ref 0.44–1.00)
GFR, Estimated: >60 mL/min (ref >60)
Glucose, Bld: 107 mg/dL — ABNORMAL HIGH (ref 70–99)
Potassium: 4.3 mmol/L (ref 3.5–5.1)
Sodium: 137 mmol/L (ref 135–145)
Total Bilirubin: 1.1 mg/dL (ref 0.3–1.2)
Total Protein: 7.4 g/dL (ref 6.5–8.1)

## 2022-07-01 LAB — CBC WITH DIFFERENTIAL/PLATELET
Abs Immature Granulocytes: 0.01 10*3/uL (ref 0.00–0.07)
Basophils Absolute: 0 10*3/uL (ref 0.0–0.1)
Basophils Relative: 0 %
Eosinophils Absolute: 0.2 10*3/uL (ref 0.0–0.5)
Eosinophils Relative: 4 %
HCT: 38.2 % (ref 36.0–46.0)
Hemoglobin: 12 g/dL (ref 12.0–15.0)
Immature Granulocytes: 0 %
Lymphocytes Relative: 17 %
Lymphs Abs: 0.8 10*3/uL (ref 0.7–4.0)
MCH: 28.2 pg (ref 26.0–34.0)
MCHC: 31.4 g/dL (ref 30.0–36.0)
MCV: 89.9 fL (ref 80.0–100.0)
Monocytes Absolute: 0.5 10*3/uL (ref 0.1–1.0)
Monocytes Relative: 11 %
Neutro Abs: 3.3 10*3/uL (ref 1.7–7.7)
Neutrophils Relative %: 68 %
Platelets: 198 10*3/uL (ref 150–400)
RBC: 4.25 MIL/uL (ref 3.87–5.11)
RDW: 13 % (ref 11.5–15.5)
WBC: 4.8 10*3/uL (ref 4.0–10.5)
nRBC: 0 % (ref 0.0–0.2)

## 2022-07-01 LAB — RAD ONC ARIA SESSION SUMMARY
Course Elapsed Days: 27
Plan Fractions Treated to Date: 3
Plan Prescribed Dose Per Fraction: 2 Gy
Plan Total Fractions Prescribed: 5
Plan Total Prescribed Dose: 10 Gy
Reference Point Dosage Given to Date: 48.56 Gy
Reference Point Session Dosage Given: 2 Gy
Session Number: 19

## 2022-07-02 ENCOUNTER — Other Ambulatory Visit: Payer: Self-pay

## 2022-07-02 ENCOUNTER — Ambulatory Visit
Admission: RE | Admit: 2022-07-02 | Discharge: 2022-07-02 | Disposition: A | Discharge status: Home / Self Care | Payer: BC Managed Care – PPO | Source: Ambulatory Visit | Attending: Radiation Oncology | Admitting: Radiation Oncology

## 2022-07-02 DIAGNOSIS — Z510 Encounter for antineoplastic radiation therapy: Secondary | ICD-10-CM | POA: Diagnosis not present

## 2022-07-02 LAB — RAD ONC ARIA SESSION SUMMARY
Course Elapsed Days: 28
Plan Fractions Treated to Date: 4
Plan Prescribed Dose Per Fraction: 2 Gy
Plan Total Fractions Prescribed: 5
Plan Total Prescribed Dose: 10 Gy
Reference Point Dosage Given to Date: 50.56 Gy
Reference Point Session Dosage Given: 2 Gy
Session Number: 20

## 2022-07-02 LAB — FOLLICLE STIMULATING HORMONE: FSH: 64.9 m[IU]/mL

## 2022-07-02 LAB — LUTEINIZING HORMONE: LH: 37.1 m[IU]/mL

## 2022-07-02 LAB — ESTRADIOL: Estradiol: 12.4 pg/mL

## 2022-07-03 ENCOUNTER — Ambulatory Visit
Admission: RE | Admit: 2022-07-03 | Discharge: 2022-07-03 | Disposition: A | Discharge status: Home / Self Care | Payer: BC Managed Care – PPO | Source: Ambulatory Visit | Attending: Radiation Oncology | Admitting: Radiation Oncology

## 2022-07-03 ENCOUNTER — Other Ambulatory Visit: Payer: Self-pay

## 2022-07-03 DIAGNOSIS — Z510 Encounter for antineoplastic radiation therapy: Secondary | ICD-10-CM | POA: Diagnosis not present

## 2022-07-03 LAB — RAD ONC ARIA SESSION SUMMARY
Course Elapsed Days: 29
Plan Fractions Treated to Date: 5
Plan Prescribed Dose Per Fraction: 2 Gy
Plan Total Fractions Prescribed: 5
Plan Total Prescribed Dose: 10 Gy
Reference Point Dosage Given to Date: 52.56 Gy
Reference Point Session Dosage Given: 2 Gy
Session Number: 21

## 2022-07-13 ENCOUNTER — Inpatient Hospital Stay (HOSPITAL_BASED_OUTPATIENT_CLINIC_OR_DEPARTMENT_OTHER): Payer: BC Managed Care – PPO | Admitting: Oncology

## 2022-07-13 ENCOUNTER — Encounter: Payer: Self-pay | Admitting: Oncology

## 2022-07-13 VITALS — BP 111/82 | HR 74 | Temp 98.3°F | Resp 18 | Wt 127.9 lb

## 2022-07-13 DIAGNOSIS — M8580 Other specified disorders of bone density and structure, unspecified site: Secondary | ICD-10-CM

## 2022-07-13 DIAGNOSIS — D0511 Intraductal carcinoma in situ of right breast: Secondary | ICD-10-CM

## 2022-07-13 DIAGNOSIS — Z510 Encounter for antineoplastic radiation therapy: Secondary | ICD-10-CM | POA: Diagnosis not present

## 2022-07-13 MED ORDER — ANASTROZOLE 1 MG PO TABS: 1.0000 mg | ORAL_TABLET | Freq: Every day | ORAL | 3 refills | Status: DC

## 2022-07-13 NOTE — Progress Notes (Signed)
Hematology/Oncology Consult note [phone] [phone]         Patient Care Team: Leonel Ramsay, MD as PCP - General (Infectious Diseases) Theodore Demark, RN (Inactive) as Oncology Nurse Navigator Herbert Pun, MD as Consulting Physician (General Surgery) Earlie Server, MD as Consulting Physician (Oncology)  REFERRING PROVIDER: Leonel Ramsay, MD  CHIEF COMPLAINTS/REASON FOR VISIT:   DCIS  HISTORY OF PRESENTING ILLNESS:   Jacqueline Murray is a  56 y.o.  female with PMH listed below was seen in consultation at the request of  Leonel Ramsay, MD  for evaluation of DCIS  Patient has a history of papilloma in 2021. 04/29/2020, bilateral breast MRI showed suspicious clumped non-mass enhancement at 12:00 in the right breast, and an indeterminate suspicious mass at the 9:00 in the right breast. 05/17/2020, 9:00 breast mass positive for papilloma with usual ductal hyperplasia.  12:00 mass was benign. 06/05/2020, right breast excisional biopsy showed benign breast tissue with intraductal papilloma, incidental fibroadenoma, fibrocystic changes, usual ductal hyperplasia, prior biopsy changes.  Negative for atypia and malignancy.  Right breast ductal excisional biopsy was negative for atypia and malignancy.  Patient complains of history of bloody right nipple discharge.  History of papilloma excised in 2021.  She also has a left breast mass was originally seen on 02/12/2020 study and being watched. 02/25/2022, bilateral breast mammogram showed Suspicious calcifications in the upper outer quadrant of the right breast; intra ductal mass in the 3:00 region of the right breast 3 cm from nipple.;  Stable benign-appearing mass in the 3: 30 region of the left breast.  Sonographic evaluation of the right axilla does not show any enlarged adenopathy.  03/09/2022, patient underwent right breast 3:00 3 cm from the nipple, ultrasound-guided biopsy, pathology showed fragment  of intraductal papilloma, with associated usual ductal hyperplasia.  Background mammary parenchyma with fibrocystic changes negative for atypical proliferative breast disease.;  Right upper outer quadrant calcification stereotactic core needle biopsy showed DCIS, high-grade with comedonecrosis.  Calcifications associated with DCIS.  Negative for invasive mammary carcinoma.  Patient has been seen by Dr. Peyton Najjar.  Recommended bilateral MRI for further evaluation of the extent of DCIS 03/20/2022, bilateral breast MRI 1. Large area of non mass enhancement within the superior central right breast adjacent to the marking clip, potentially representing additional DCIS adjacent to the recent site of biopsy. 2. Multiple enhancing masses are demonstrated throughout the right and left breast. These are indeterminate in etiology however may represent a background of papillomatosis. Additional etiologies not excluded. 3. Cortically thickened right axillary lymph node, indeterminate.  Patient was referred to establish care with oncology for further evaluation management.  Patient has no new complaints today.  Some soreness at the site of previous biopsy. Family history is positive for father with prostate cancer.  No family history of breast cancer.  Menarche 56 years of age Age at first birth 75, she has 1 daughter. Patient has taken OCP 20+ years. History of hysterectomy. + Hormone replacement therapy since 2016 /2017, recently stopped Previous right breast excisional biopsy-papilloma  03/20/2022, bilateral breast MRI showed additional non-mass enhancement adjacent to the biopsy area, as well as multiple enhancing masses throughout the right and left breast.  Cortically thickened right axillary lymph node. Patient will get additional biopsy-MRI biopsy of the non-mass enhancement within the superior central right breast, dominant mass within the lower outer right breast, and dominant mass in the left breast.   Ultrasound for second look at the axillary lymph  node.  04/09/22, additional fine-needle biopsy right breast superior central core needle biopsy showed usual ductal hyperplasia, columnar cells and fibrocystic changes.  Fat necrosis and foreign body giant cell reaction.  No malignancy identified. Right breast lower outer needle biopsy showed usual ductal hyperplasia and fibrocystic changes.  No malignancy identified. Left breast lower outer needle biopsy showed usual ductal hyperplasia and columnar cell and fibrocystic changes.  No malignancy identified. All were found to be concordant by Dr. Lovey Newcomer.  04/30/2022, second look right axilla showed no suspicious right axillary lymphadenopathy.  Biopsy was canceled  05/06/2022, patient underwent lumpectomy. Right breast partial mastectomy showed a single foci of low-grade DCIS in situ, 2.5 mm, completely excised.  Single separated focus of atypical ductal hyperplasia.  Background benign mammary parenchyma with intraductal papilloma, focally sclerosing, fibrocystic changes, and florid usual ductal hyperplasia.  Negative for malignancy and residual high-grade ductal carcinoma in situ.  Sentinel lymph node biopsy showed 4 lymph nodes all negative for malignancy.  Additional right breast excision showed benign mammary parenchyma with intraductal papilloma, associated florid usual ductal hyperplasia.  Negative for atypical perforated breast disease. pTis pN0, estrogen receptor 90% positive.  INTERVAL HISTORY Jacqueline Murray is a 56 y.o. female who has above history reviewed by me today presents for follow up visit for management of DCIS  06/04/2022 - 06/26/2022 status post adjuvant radiation Patient has had radiation dermatology toxicities.  She applies moisturizer twice daily. No other new complaints . Review of Systems  Constitutional:  Negative for appetite change, chills, fatigue and fever.  HENT:   Negative for hearing loss and voice change.   Eyes:   Negative for eye problems.  Respiratory:  Negative for chest tightness and cough.   Cardiovascular:  Negative for chest pain.  Gastrointestinal:  Negative for abdominal distention, abdominal pain and blood in stool.  Endocrine: Negative for hot flashes.  Genitourinary:  Negative for difficulty urinating and frequency.   Musculoskeletal:  Negative for arthralgias.  Skin:  Negative for itching and rash.  Neurological:  Negative for extremity weakness.  Hematological:  Negative for adenopathy.  Psychiatric/Behavioral:  Negative for confusion.     MEDICAL HISTORY:  Past Medical History:  Diagnosis Date   Anemia    Colon polyp 10/93/2355   Complication of anesthesia    Ductal carcinoma in situ (DCIS) of right breast    GERD (gastroesophageal reflux disease)    Headache    HLD (hyperlipidemia)    Hypertension    IBS (irritable bowel syndrome)    Intraductal papilloma of breast 01/21/2012   left breast   Intraductal papilloma of breast, right    PONV (postoperative nausea and vomiting)    EXTREME NAUSEA    SURGICAL HISTORY: Past Surgical History:  Procedure Laterality Date   ABDOMINAL HYSTERECTOMY     BREAST BIOPSY Bilateral few yrs ago   benign-cyst   BREAST BIOPSY Right 1986   fibroadenoma   BREAST BIOPSY Right 05/17/2020   neg/ MRI bx 2 areas   BREAST BIOPSY Right 03/09/2022   rt breast stereo calcs x clip path pending   BREAST BIOPSY WITH RADIO FREQUENCY LOCALIZER Right 06/05/2020   Procedure: BREAST BIOPSY WITH RADIO FREQUENCY LOCALIZER;  Surgeon: Herbert Pun, MD;  Location: [facility] OR[facility];  Service: General;  Laterality: Right;   breast biposy Right 03/09/2022   u/s core bc heart clip path pending   BREAST EXCISIONAL BIOPSY Right 05/26/2020   neg   BREAST EXCISIONAL BIOPSY Left 2013   neg  CESAREAN SECTION     COLONOSCOPY     PARTIAL MASTECTOMY WITH AXILLARY SENTINEL LYMPH NODE BIOPSY Right 05/06/2022   Procedure: PARTIAL MASTECTOMY WITH AXILLARY SENTINEL  LYMPH NODE BIOPSY With RF tag;  Surgeon: Herbert Pun, MD;  Location: [facility] OR[facility];  Service: General;  Laterality: Right;    SOCIAL HISTORY: Social History   Socioeconomic History   Marital status: Divorced    Spouse name: Not on file   Number of children: Not on file   Years of education: Not on file   Highest education level: Not on file  Occupational History   Not on file  Tobacco Use   Smoking status: Never    Passive exposure: Never   Smokeless tobacco: Never  Vaping Use   Vaping Use: Never used  Substance and Sexual Activity   Alcohol use: Not Currently   Drug use: Never   Sexual activity: Not Currently  Other Topics Concern   Not on file  Social History Narrative   Not on file   Social Determinants of Health   Financial Resource Strain: Not on file  Food Insecurity: Not on file  Transportation Needs: Not on file  Physical Activity: Not on file  Stress: Not on file  Social Connections: Not on file  Intimate Partner Violence: Not on file    FAMILY HISTORY: Family History  Problem Relation Age of Onset   Diabetes Mellitus II Mother    Heart disease Mother    Prostate cancer Father 39       metastatic   Hypertension Father    CAD Father    Hypertension Sister    Hyperlipidemia Sister    CAD Brother    Diabetes Mellitus II Brother    Heart disease Brother    Kidney disease Brother     ALLERGIES:  is allergic to ace inhibitors, latex, povidone iodine, shellfish-derived products, and tape.  MEDICATIONS:  Current Outpatient Medications  Medication Sig Dispense Refill   anastrozole (ARIMIDEX) 1 MG tablet Take 1 tablet (1 mg total) by mouth daily. 30 tablet 3   Calcium Carbonate (CALTRATE 600 PO) Take 600 mg by mouth daily.     loratadine (CLARITIN) 10 MG tablet Take 10 mg by mouth at bedtime.     montelukast (SINGULAIR) 10 MG tablet Take 10 mg by mouth at bedtime.     Multiple Vitamins-Minerals (MULTIVITAMIN ADULT, MINERALS,) TABS Take 1 tablet by  mouth at bedtime.     omeprazole (PRILOSEC) 20 MG capsule Take 20 mg by mouth daily as needed (Heartburn).     simvastatin (ZOCOR) 20 MG tablet Take 20 mg by mouth at bedtime.     spironolactone (ALDACTONE) 100 MG tablet Take 100 mg by mouth daily.     triamcinolone (NASACORT) 55 MCG/ACT AERO nasal inhaler Place 2 sprays into the nose daily.      No current facility-administered medications for this visit.     PHYSICAL EXAMINATION: ECOG PERFORMANCE STATUS: 0 - Asymptomatic Vitals:   07/13/22 1413  BP: 111/82  Pulse: 74  Resp: 18  Temp: 98.3 F (36.8 C)   Filed Weights   07/13/22 1413  Weight: 127 lb 14.4 oz (58 kg)    Physical Exam Constitutional:      General: She is not in acute distress. HENT:     Head: Normocephalic and atraumatic.  Eyes:     General: No scleral icterus. Cardiovascular:     Rate and Rhythm: Normal rate and regular rhythm.     Heart  sounds: Normal heart sounds.  Pulmonary:     Effort: Pulmonary effort is normal. No respiratory distress.     Breath sounds: No wheezing.  Abdominal:     General: Bowel sounds are normal. There is no distension.     Palpations: Abdomen is soft.  Musculoskeletal:        General: No deformity. Normal range of motion.     Cervical back: Normal range of motion and neck supple.  Skin:    General: Skin is warm and dry.     Findings: No erythema or rash.  Neurological:     Mental Status: She is alert and oriented to person, place, and time. Mental status is at baseline.     Cranial Nerves: No cranial nerve deficit.     Coordination: Coordination normal.  Psychiatric:        Mood and Affect: Mood normal.      LABORATORY DATA:  I have reviewed the data as listed Lab Results  Component Value Date   WBC 4.8 07/01/2022   HGB 12.0 07/01/2022   HCT 38.2 07/01/2022   MCV 89.9 07/01/2022   PLT 198 07/01/2022   Recent Labs    03/25/22 1046 07/01/22 0834  NA 135 137  K 4.0 4.3  CL 105 110  CO2 26 23  GLUCOSE 93  107*  BUN 18 15  CREATININE 0.86 0.88  CALCIUM 9.2 9.3  GFRNONAA >60 >60  PROT 7.5 7.4  ALBUMIN 4.0 4.3  AST 25 37  ALT 22 45*  ALKPHOS 41 44  BILITOT 1.3* 1.1    Iron/TIBC/Ferritin/ %Sat No results found for: "IRON", "TIBC", "FERRITIN", "IRONPCTSAT"    RADIOGRAPHIC STUDIES: I have personally reviewed the radiological images as listed and agreed with the findings in the report. No results found.    ASSESSMENT & PLAN:  1. Ductal carcinoma in situ (DCIS) of right breast   2. Osteopenia, unspecified location    Cancer Staging  Ductal carcinoma in situ (DCIS) of right breast Staging form: Breast, AJCC 8th Edition - Pathologic stage from 05/21/2022: Stage 0 (pTis (DCIS), pN0, cM0, G1, ER+, PR: Not Assessed, HER2: Not Assessed) - Signed by Earlie Server, MD on 05/21/2022   #Right breast DCIS, ER positive. pTis pN0, status post lumpectomy followed by adjuvant radiation  Patient is postmenopausal-FSH above 40 Rationale of using aromatase inhibitor -Arimidex  discussed with patient.  Side effects of Arimidex including but not limited to hot flush, joint pain, fatigue, mood swing, osteoporosis discussed with patient. Patient voices understanding and willing to proceed.  Annual mammogram surveillance.  #Osteopenia, last DEXA was done at [facility] in 2019.  Recommend patient to ask PCP to order another bone density study.  Recommend calcium 1200 mg daily plus vitamin D supplementation.  #Family history of cancer, Patient has establish care with genetic counselor and had [employer] done.  No pathological mutations.she has PALB2 VUS.   Orders Placed This Encounter  Procedures   CBC with Differential/Platelet    Standing Status:   Future    Standing Expiration Date:   07/14/2023   Comprehensive metabolic panel    Standing Status:   Future    Standing Expiration Date:   07/14/2023    All questions were answered. The patient knows to call the clinic with any problems questions or  concerns.  cc Leonel Ramsay, MD    Return of visit: 3 months Thank you for this kind referral and the opportunity to participate in the care of  this patient. A copy of today's note is routed to referring provider   Earlie Server, MD, PhD [facility] Hematology Oncology 07/13/2022

## 2022-08-10 ENCOUNTER — Ambulatory Visit
Admission: RE | Admit: 2022-08-10 | Discharge: 2022-08-10 | Disposition: A | Discharge status: Home / Self Care | Payer: BC Managed Care – PPO | Source: Ambulatory Visit | Attending: Radiation Oncology | Admitting: Radiation Oncology

## 2022-08-10 ENCOUNTER — Encounter: Payer: Self-pay | Admitting: Radiation Oncology

## 2022-08-10 VITALS — BP 121/82 | HR 80 | Temp 98.0°F | Resp 16 | Ht 64.0 in | Wt 126.0 lb

## 2022-08-10 DIAGNOSIS — D0511 Intraductal carcinoma in situ of right breast: Secondary | ICD-10-CM | POA: Diagnosis not present

## 2022-08-10 DIAGNOSIS — Z79811 Long term (current) use of aromatase inhibitors: Secondary | ICD-10-CM | POA: Diagnosis not present

## 2022-08-10 DIAGNOSIS — Z923 Personal history of irradiation: Secondary | ICD-10-CM | POA: Insufficient documentation

## 2022-08-10 DIAGNOSIS — Z170 Estrogen receptor positive status [ER+]: Secondary | ICD-10-CM | POA: Insufficient documentation

## 2022-08-10 NOTE — Progress Notes (Signed)
Radiation Oncology Follow up Note  Name: Jacqueline Murray   Date:   08/10/2022 MRN:  [id number] DOB: 06/04/1966    This 56 y.o. 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  REFERRING PROVIDER: Leonel Ramsay, MD  HPI: Patient is a 56 year old female now at 1 month having completed whole breast radiation to her right breast for ER positive ductal carcinoma in situ.  Seen today in routine follow-up she is doing well.  She specifically denies breast tenderness cough or bone pain.  She has been started on.  Arimidex tolerating that well without side effect.  COMPLICATIONS OF TREATMENT: none  FOLLOW UP COMPLIANCE: keeps appointments   PHYSICAL EXAM:  BP 121/82 (BP Location: Left Arm, Patient Position: Sitting, Cuff Size: Normal)   Pulse 80   Temp 98 F (36.7 C) (Tympanic)   Resp 16   Ht '5\' 4"'$  (1.626 m)   Wt 126 lb (57.2 kg)   BMI 21.63 kg/m  Lungs are clear to A&P cardiac examination essentially unremarkable with regular rate and rhythm. No dominant mass or nodularity is noted in either breast in 2 positions examined. Incision is well-healed. No axillary or supraclavicular adenopathy is appreciated. Cosmetic result is excellent.  Still some slight hyperpigmentation of the skin is noted.  Well-developed well-nourished patient in NAD. HEENT reveals PERLA, EOMI, discs not visualized.  Oral cavity is clear. No oral mucosal lesions are identified. Neck is clear without evidence of cervical or supraclavicular adenopathy. Lungs are clear to A&P. Cardiac examination is essentially unremarkable with regular rate and rhythm without murmur rub or thrill. Abdomen is benign with no organomegaly or masses noted. Motor sensory and DTR levels are equal and symmetric in the upper and lower extremities. Cranial nerves II through XII are grossly intact. Proprioception is intact. No  peripheral adenopathy or edema is identified. No motor or sensory levels are noted. Crude visual fields are within normal range.  RADIOLOGY RESULTS: No current films for review  PLAN: Present time patient is doing well 1 month out from whole breast radiation.  I am pleased with her overall progress.  I have asked to see her back in 4 to 5 months for follow-up.  She continues on Arimidex without side effect.  Patient knows to call with any concerns.  I would like to take this opportunity to thank you for allowing me to participate in the care of your patient.Noreene Filbert, MD

## 2022-09-01 ENCOUNTER — Ambulatory Visit
Admission: RE | Admit: 2022-09-01 | Discharge: 2022-09-01 | Disposition: A | Discharge status: Home / Self Care | Payer: BC Managed Care – PPO | Source: Ambulatory Visit | Attending: Oncology | Admitting: Oncology

## 2022-09-01 ENCOUNTER — Encounter: Payer: Self-pay | Admitting: Oncology

## 2022-09-01 ENCOUNTER — Other Ambulatory Visit: Payer: Self-pay

## 2022-09-01 DIAGNOSIS — M79604 Pain in right leg: Secondary | ICD-10-CM

## 2022-09-01 NOTE — Telephone Encounter (Signed)
Please schedule STAT US and notify pt of appt

## 2022-09-07 ENCOUNTER — Ambulatory Visit
Admission: EM | Admit: 2022-09-07 | Discharge: 2022-09-07 | Disposition: A | Discharge status: Home / Self Care | Payer: BC Managed Care – PPO | Attending: Emergency Medicine | Admitting: Emergency Medicine

## 2022-09-07 DIAGNOSIS — R21 Rash and other nonspecific skin eruption: Secondary | ICD-10-CM | POA: Diagnosis not present

## 2022-09-07 DIAGNOSIS — R202 Paresthesia of skin: Secondary | ICD-10-CM | POA: Diagnosis not present

## 2022-09-07 MED ORDER — TRIAMCINOLONE ACETONIDE 0.1 % EX CREA: 1.0000 | TOPICAL_CREAM | Freq: Two times a day (BID) | CUTANEOUS | 0 refills | Status: DC

## 2022-09-07 NOTE — ED Triage Notes (Signed)
Patient to Urgent Care with complaints of pain and rash present to right foot x2 weeks. Patient takes anastrozole for breast cancer (stopped taking this when symptoms started). Follows up with oncologist next week.   Reports symptoms first started September 8th, describes pain down leg/ arch of foot- radiated from buttock down entire leg. Had a negative ultrasound for DVT. Reports that the pain still hasn't resolved, is now having a rash present to her lower leg and arch of right foot. Describes pain as tingling/ throbbing.   Has been taking BC powders with some relief- initially thought she had sciatica.

## 2022-09-07 NOTE — ED Provider Notes (Signed)
Roderic Palau    CSN: [id number] Arrival date & time: 09/07/22  4132      History   Chief Complaint Chief Complaint  Patient presents with   Rash    HPI CHYANNA FLOCK is a 56 y.o. female.  Patient present with RLE burning and tingling pain since 08/28/2022.  The pain originally was from her right buttock to her right foot but has improved.  The pain is now only in her right lower leg.  No falls or trauma.  She developed a rash on her right foot yesterday.  The rash is painful to touch.  No fever, chills, sore throat, cough, chest pain, shortness of breath, vomiting, diarrhea, or other symptoms.  Treatment attempted with BC powder.  She has breast CA and was taking anastrozole.  Patient contacted her oncologist on 09/01/2022 about her right leg pain; instructed to stop the anastrozole to see if her symptoms improved.  Ultrasound of RLE on 09/01/2022 negative for DVT.  Her medical history also includes hypertension.  The history is provided by the patient and medical records.    Past Medical History:  Diagnosis Date   Anemia    Colon polyp 44/12/270   Complication of anesthesia    Ductal carcinoma in situ (DCIS) of right breast    GERD (gastroesophageal reflux disease)    Headache    HLD (hyperlipidemia)    Hypertension    IBS (irritable bowel syndrome)    Intraductal papilloma of breast 01/21/2012   left breast   Intraductal papilloma of breast, right    PONV (postoperative nausea and vomiting)    EXTREME NAUSEA    Patient Active Problem List   Diagnosis Date Noted   Genetic testing 04/06/2022   Goals of care, counseling/discussion 03/25/2022   Ductal carcinoma in situ (DCIS) of right breast 03/25/2022    Past Surgical History:  Procedure Laterality Date   ABDOMINAL HYSTERECTOMY     BREAST BIOPSY Bilateral few yrs ago   benign-cyst   BREAST BIOPSY Right 1986   fibroadenoma   BREAST BIOPSY Right 05/17/2020   neg/ MRI bx 2 areas   BREAST BIOPSY Right  03/09/2022   rt breast stereo calcs x clip path pending   BREAST BIOPSY WITH RADIO FREQUENCY LOCALIZER Right 06/05/2020   Procedure: BREAST BIOPSY WITH RADIO FREQUENCY LOCALIZER;  Surgeon: Herbert Pun, MD;  Location: [facility] ORS;  Service: General;  Laterality: Right;   breast biposy Right 03/09/2022   u/s core bc heart clip path pending   BREAST EXCISIONAL BIOPSY Right 05/26/2020   neg   BREAST EXCISIONAL BIOPSY Left 2013   neg   CESAREAN SECTION     COLONOSCOPY     PARTIAL MASTECTOMY WITH AXILLARY SENTINEL LYMPH NODE BIOPSY Right 05/06/2022   Procedure: PARTIAL MASTECTOMY WITH AXILLARY SENTINEL LYMPH NODE BIOPSY With RF tag;  Surgeon: Herbert Pun, MD;  Location: [facility] ORS;  Service: General;  Laterality: Right;    OB History   No obstetric history on file.      Home Medications    Prior to Admission medications   Medication Sig Start Date End Date Taking? Authorizing Provider  triamcinolone cream (KENALOG) 0.1 % Apply 1 Application topically 2 (two) times daily. 09/07/22  Yes Sharion Balloon, NP  anastrozole (ARIMIDEX) 1 MG tablet Take 1 tablet (1 mg total) by mouth daily. 07/13/22   Earlie Server, MD  Calcium Carbonate (CALTRATE 600 PO) Take 600 mg by mouth daily.    [provider]  loratadine (CLARITIN) 10 MG tablet Take 10 mg by mouth at bedtime.    [provider]  montelukast (SINGULAIR) 10 MG tablet Take 10 mg by mouth at bedtime. 04/30/20   [provider]  Multiple Vitamins-Minerals (MULTIVITAMIN ADULT, MINERALS,) TABS Take 1 tablet by mouth at bedtime.    [provider]  omeprazole (PRILOSEC) 20 MG capsule Take 20 mg by mouth daily as needed (Heartburn).    [provider]  simvastatin (ZOCOR) 20 MG tablet Take 20 mg by mouth at bedtime. 03/31/20   [provider]  spironolactone (ALDACTONE) 100 MG tablet Take 100 mg by mouth daily. 03/29/20   [provider]  triamcinolone (NASACORT) 55 MCG/ACT AERO  nasal inhaler Place 2 sprays into the nose daily.     [provider]    Family History Family History  Problem Relation Age of Onset   Diabetes Mellitus II Mother    Heart disease Mother    Prostate cancer Father 21       metastatic   Hypertension Father    CAD Father    Hypertension Sister    Hyperlipidemia Sister    CAD Brother    Diabetes Mellitus II Brother    Heart disease Brother    Kidney disease Brother     Social History Social History   Tobacco Use   Smoking status: Never    Passive exposure: Never   Smokeless tobacco: Never  Vaping Use   Vaping Use: Never used  Substance Use Topics   Alcohol use: Not Currently   Drug use: Never     Allergies   Ace inhibitors, Latex, Povidone iodine, Shellfish-derived products, and Tape   Review of Systems Review of Systems  Constitutional:  Negative for chills and fever.  HENT:  Negative for ear pain and sore throat.   Respiratory:  Negative for cough and shortness of breath.   Cardiovascular:  Negative for chest pain and palpitations.  Gastrointestinal:  Negative for abdominal pain, diarrhea and vomiting.  Musculoskeletal:  Negative for arthralgias, gait problem and joint swelling.  Skin:  Positive for rash.  Neurological:  Negative for weakness and numbness.  All other systems reviewed and are negative.    Physical Exam Triage Vital Signs ED Triage Vitals  Enc Vitals Group     BP      Pulse      Resp      Temp      Temp src      SpO2      Weight      Height      Head Circumference      Peak Flow      Pain Score      Pain Loc      Pain Edu?      Excl. in [place]?    No data found.  Updated Vital Signs BP 119/84   Pulse 82   Temp 98.4 F (36.9 C)   Resp 18   Ht '5\' 4"'$  (1.626 m)   Wt 126 lb (57.2 kg)   SpO2 98%   BMI 21.63 kg/m   Visual Acuity Right Eye Distance:   Left Eye Distance:   Bilateral Distance:    Right Eye Near:   Left Eye Near:    Bilateral Near:     Physical  Exam Vitals and nursing note reviewed.  Constitutional:      General: She is not in acute distress.    Appearance: Normal appearance. She is  well-developed. She is not ill-appearing.  HENT:     Mouth/Throat:     Mouth: Mucous membranes are moist.  Cardiovascular:     Rate and Rhythm: Normal rate and regular rhythm.     Heart sounds: Normal heart sounds.  Pulmonary:     Effort: Pulmonary effort is normal. No respiratory distress.     Breath sounds: Normal breath sounds.  Musculoskeletal:        General: No swelling or deformity. Normal range of motion.     Cervical back: Neck supple.  Skin:    General: Skin is warm and dry.     Capillary Refill: Capillary refill takes less than 2 seconds.     Findings: Rash present.     Comments: Tender to tough rash on right foot and ankle.  See pictures for details.  Neurological:     General: No focal deficit present.     Mental Status: She is alert and oriented to person, place, and time.     Gait: Gait normal.  Psychiatric:        Mood and Affect: Mood normal.        Behavior: Behavior normal.             UC Treatments / Results  Labs (all labs ordered are listed, but only abnormal results are displayed) Labs Reviewed - No data to display  EKG   Radiology No results found.  Procedures Procedures (including critical care time)  Medications Ordered in UC Medications - No data to display  Initial Impression / Assessment and Plan / UC Course  I have reviewed the triage vital signs and the nursing notes.  Pertinent labs & imaging results that were available during my care of the patient were reviewed by me and considered in my medical decision making (see chart for details).   Rash, right leg paresthesias.  Afebrile, vital signs are stable.  Patient is well-appearing other than rash.  Patient voiced concern for shingles but the rash crosses multiple dermatomal lines.  Her oncologist had stopped her anastrozole on 09/01/2022  to see if her leg pain would improve.  She had negative ultrasound for DVT that day also. She states her leg pain has improved but not resolved.  Still having burning tingling pain in lower leg from knee to foot.  Today I am treating the rash with triamcinolone cream.  Instructed patient to follow-up with her PCP or her oncologist for follow up of symptoms and to discuss if she should restart the anastrozole.  Patient agrees to plan of care.   Final Clinical Impressions(s) / UC Diagnoses   Final diagnoses:  Rash  Right leg paresthesias     Discharge Instructions      Use the triamcinolone cream on your rash as directed.  Follow up with your primary care provider or oncologist.        ED Prescriptions     Medication Sig Dispense Auth. Provider   triamcinolone cream (KENALOG) 0.1 % Apply 1 Application topically 2 (two) times daily. 30 g Sharion Balloon, NP      PDMP not reviewed this encounter.   Sharion Balloon, NP 09/07/22 1023

## 2022-09-07 NOTE — Discharge Instructions (Addendum)
Use the triamcinolone cream on your rash as directed.  Follow up with your primary care provider or oncologist.

## 2022-09-09 NOTE — Telephone Encounter (Signed)
Wynona Canes.   Dr. Tasia Catchings wanting to offer pt [facility]. I have sent her a Pharmacist, community message. Response pending.

## 2022-09-10 ENCOUNTER — Inpatient Hospital Stay: Payer: BC Managed Care – PPO | Attending: Oncology | Admitting: Nurse Practitioner

## 2022-09-10 ENCOUNTER — Encounter: Payer: Self-pay | Admitting: Nurse Practitioner

## 2022-09-10 VITALS — BP 115/81 | HR 77 | Temp 98.4°F | Resp 16 | Wt 129.6 lb

## 2022-09-10 DIAGNOSIS — M79671 Pain in right foot: Secondary | ICD-10-CM | POA: Diagnosis not present

## 2022-09-10 DIAGNOSIS — R21 Rash and other nonspecific skin eruption: Secondary | ICD-10-CM

## 2022-09-10 DIAGNOSIS — D0511 Intraductal carcinoma in situ of right breast: Secondary | ICD-10-CM | POA: Insufficient documentation

## 2022-09-10 DIAGNOSIS — Z79811 Long term (current) use of aromatase inhibitors: Secondary | ICD-10-CM | POA: Diagnosis not present

## 2022-09-10 MED ORDER — CLOBETASOL PROPIONATE 0.05 % EX FOAM: Freq: Two times a day (BID) | CUTANEOUS | 0 refills | Status: DC

## 2022-09-10 MED ORDER — PREDNISONE 10 MG (21) PO TBPK: ORAL_TABLET | ORAL | 0 refills | Status: DC

## 2022-09-10 NOTE — Progress Notes (Signed)
Symptom [facility] [place] [facility] [place]. [facility] [address] [place], [place] [address] [phone]) [phone] (fax)  Patient Care Team: Leonel Ramsay, MD as PCP - General (Infectious Diseases) Theodore Demark, RN (Inactive) as Oncology Nurse Navigator Herbert Pun, MD as Consulting Physician (General Surgery) Earlie Server, MD as Consulting Physician (Oncology)   Name of the patient: Jacqueline Murray  [id number]  12-Feb-1966   Date of visit: 09/10/22  Diagnosis- DCIS  Chief complaint/ Reason for visit- Rash on feet  Heme/Onc history:  Oncology History  Ductal carcinoma in situ (DCIS) of right breast  03/25/2022 Initial Diagnosis   Ductal carcinoma in situ (DCIS) of right breast    Genetic Testing   Negative genetic testing. No pathogenic variants identified on the [employer] CustomNext+RNA panel. 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 The report date is 04/03/2022.  The CustomNext-Cancer+RNAinsight panel offered by [facility] includes sequencing and rearrangement analysis for the following 47 genes:  APC, ATM, AXIN2, BARD1, BMPR1A, BRCA1, BRCA2, BRIP1, CDH1, CDK4, CDKN2A, CHEK2, DICER1, EPCAM, GREM1, HOXB13, MEN1, MLH1, MSH2, MSH3, MSH6, MUTYH, NBN, NF1, NF2, NTHL1, PALB2, PMS2, POLD1, POLE, PTEN, RAD51C, RAD51D, RECQL, RET, SDHA, SDHAF2, SDHB, SDHC, SDHD, SMAD4, SMARCA4, STK11, TP53, TSC1, TSC2, and VHL.  RNA data is routinely analyzed for use in variant interpretation for all genes.    05/21/2022 Cancer Staging   Staging form: Breast, AJCC 8th Edition - Pathologic stage from 05/21/2022: Stage 0 (pTis (DCIS), pN0, cM0, G1, ER+, PR: Not Assessed, HER2: Not Assessed) - Signed by Earlie Server, MD on 05/21/2022 Stage prefix: Initial diagnosis Nuclear grade: G1 Multigene prognostic tests performed: None Histologic grading system: 3 grade system     Interval history- Patient is  56 year old female with above history of DCIS currently on aromatase inhibitor who presents to clinic for complaints of rash on her right foot. Symptoms started approximately 2-3 weeks ago on right bottom of foot, near toes. Describes as painful and red. Noticed some small blisters. Pain radiates to her toes. She then noticed other areas that developed on back on her right knee, pin point spot on right buttock. She went to ER for her symptoms because pain was limiting her ability to walk. Was questioning if related to her anastrozole and she has held medication. No improvement with triamcinolone or holding aromatase inhibitor.   Review of systems- Review of Systems  Constitutional:  Positive for malaise/fatigue.  Skin:  Positive for rash.  Neurological:  Positive for tingling.     Allergies  Allergen Reactions   Ace Inhibitors Cough   Latex Itching and Rash   Povidone Iodine Anaphylaxis   Shellfish-Derived Products Anaphylaxis   Tape Rash    Past Medical History:  Diagnosis Date   Anemia    Colon polyp 33/00/7622   Complication of anesthesia    Ductal carcinoma in situ (DCIS) of right breast    GERD (gastroesophageal reflux disease)    Headache    HLD (hyperlipidemia)    Hypertension    IBS (irritable bowel syndrome)    Intraductal papilloma of breast 01/21/2012   left breast   Intraductal papilloma of breast, right    PONV (postoperative nausea and vomiting)    EXTREME NAUSEA    Past Surgical History:  Procedure Laterality Date   ABDOMINAL HYSTERECTOMY     BREAST BIOPSY Bilateral few yrs ago   benign-cyst   BREAST BIOPSY Right  1986   fibroadenoma   BREAST BIOPSY Right 05/17/2020   neg/ MRI bx 2 areas   BREAST BIOPSY Right 03/09/2022   rt breast stereo calcs x clip path pending   BREAST BIOPSY WITH RADIO FREQUENCY LOCALIZER Right 06/05/2020   Procedure: BREAST BIOPSY WITH RADIO FREQUENCY LOCALIZER;  Surgeon: Herbert Pun, MD;  Location: [facility] ORS;  Service:  General;  Laterality: Right;   breast biposy Right 03/09/2022   u/s core bc heart clip path pending   BREAST EXCISIONAL BIOPSY Right 05/26/2020   neg   BREAST EXCISIONAL BIOPSY Left 2013   neg   CESAREAN SECTION     COLONOSCOPY     PARTIAL MASTECTOMY WITH AXILLARY SENTINEL LYMPH NODE BIOPSY Right 05/06/2022   Procedure: PARTIAL MASTECTOMY WITH AXILLARY SENTINEL LYMPH NODE BIOPSY With RF tag;  Surgeon: Herbert Pun, MD;  Location: [facility] ORS;  Service: General;  Laterality: Right;    Social History   Socioeconomic History   Marital status: Divorced    Spouse name: Not on file   Number of children: Not on file   Years of education: Not on file   Highest education level: Not on file  Occupational History   Not on file  Tobacco Use   Smoking status: Never    Passive exposure: Never   Smokeless tobacco: Never  Vaping Use   Vaping Use: Never used  Substance and Sexual Activity   Alcohol use: Not Currently   Drug use: Never   Sexual activity: Not Currently  Other Topics Concern   Not on file  Social History Narrative   Not on file   Social Determinants of Health   Financial Resource Strain: Not on file  Food Insecurity: Not on file  Transportation Needs: Not on file  Physical Activity: Not on file  Stress: Not on file  Social Connections: Not on file  Intimate Partner Violence: Not on file    Family History  Problem Relation Age of Onset   Diabetes Mellitus II Mother    Heart disease Mother    Prostate cancer Father 109       metastatic   Hypertension Father    CAD Father    Hypertension Sister    Hyperlipidemia Sister    CAD Brother    Diabetes Mellitus II Brother    Heart disease Brother    Kidney disease Brother     Current Outpatient Medications:    Aspirin-Salicylamide-Caffeine (ARTHRITIS STRENGTH BC POWDER PO), Take by mouth., Disp: , Rfl:    Calcium Carbonate (CALTRATE 600 PO), Take 600 mg by mouth daily., Disp: , Rfl:    ibuprofen (ADVIL) 400  MG tablet, Take 400 mg by mouth every 6 (six) hours as needed., Disp: , Rfl:    loratadine (CLARITIN) 10 MG tablet, Take 10 mg by mouth at bedtime., Disp: , Rfl:    montelukast (SINGULAIR) 10 MG tablet, Take 10 mg by mouth at bedtime., Disp: , Rfl:    Multiple Vitamins-Minerals (MULTIVITAMIN ADULT, MINERALS,) TABS, Take 1 tablet by mouth at bedtime., Disp: , Rfl:    omeprazole (PRILOSEC) 20 MG capsule, Take 20 mg by mouth daily as needed (Heartburn)., Disp: , Rfl:    simvastatin (ZOCOR) 20 MG tablet, Take 20 mg by mouth at bedtime., Disp: , Rfl:    simvastatin (ZOCOR) 20 MG tablet, Take 1 tablet by mouth at bedtime., Disp: , Rfl:    spironolactone (ALDACTONE) 100 MG tablet, Take 100 mg by mouth daily., Disp: , Rfl:  triamcinolone (NASACORT) 55 MCG/ACT AERO nasal inhaler, Place 2 sprays into the nose daily. , Disp: , Rfl:    triamcinolone cream (KENALOG) 0.1 %, Apply 1 Application topically 2 (two) times daily., Disp: 30 g, Rfl: 0   anastrozole (ARIMIDEX) 1 MG tablet, Take 1 tablet (1 mg total) by mouth daily. (Patient not taking: Reported on 09/10/2022), Disp: 30 tablet, Rfl: 3  Physical exam:  Vitals:   09/10/22 1516  BP: 115/81  Pulse: 77  Resp: 16  Temp: 98.4 F (36.9 C)  TempSrc: Oral  SpO2: 100%  Weight: 129 lb 9.6 oz (58.8 kg)   Physical Exam Musculoskeletal:     Comments: Limping.   Skin:    Findings: Rash (scattered erythematous patches of vesicles. Pruritic appearing.) present.  Neurological:     Mental Status: She is alert.  Psychiatric:        Mood and Affect: Mood normal.        Behavior: Behavior normal.        Latest Ref Rng & Units 07/01/2022    8:34 AM  CMP  Glucose 70 - 99 mg/dL 107   BUN 6 - 20 mg/dL 15   Creatinine 0.44 - 1.00 mg/dL 0.88   Sodium 135 - 145 mmol/L 137   Potassium 3.5 - 5.1 mmol/L 4.3   Chloride 98 - 111 mmol/L 110   CO2 22 - 32 mmol/L 23   Calcium 8.9 - 10.3 mg/dL 9.3   Total Protein 6.5 - 8.1 g/dL 7.4   Total Bilirubin 0.3 - 1.2  mg/dL 1.1   Alkaline Phos 38 - 126 U/L 44   AST 15 - 41 U/L 37   ALT 0 - 44 U/L 45       Latest Ref Rng & Units 07/01/2022    8:34 AM  CBC  WBC 4.0 - 10.5 K/uL 4.8   Hemoglobin 12.0 - 15.0 g/dL 12.0   Hematocrit 36.0 - 46.0 % 38.2   Platelets 150 - 400 K/uL 198    US Venous Img Lower Unilateral Right  Result Date: 09/01/2022 CLINICAL DATA:  Right lower extremity pain. EXAM: RIGHT LOWER EXTREMITY VENOUS DOPPLER ULTRASOUND TECHNIQUE: Gray-scale sonography with graded compression, as well as color Doppler and duplex ultrasound were performed to evaluate the lower extremity deep venous systems from the level of the common femoral vein and including the common femoral, femoral, profunda femoral, popliteal and calf veins including the posterior tibial, peroneal and gastrocnemius veins when visible. The superficial great saphenous vein was also interrogated. Spectral Doppler was utilized to evaluate flow at rest and with distal augmentation maneuvers in the common femoral, femoral and popliteal veins. COMPARISON:  None Available. FINDINGS: Contralateral Common Femoral Vein: Respiratory phasicity is normal and symmetric with the symptomatic side. No evidence of thrombus. Normal compressibility. Common Femoral Vein: No evidence of thrombus. Normal compressibility, respiratory phasicity and response to augmentation. Saphenofemoral Junction: No evidence of thrombus. Normal compressibility and flow on color Doppler imaging. Profunda Femoral Vein: No evidence of thrombus. Normal compressibility and flow on color Doppler imaging. Femoral Vein: No evidence of thrombus. Normal compressibility, respiratory phasicity and response to augmentation. Popliteal Vein: No evidence of thrombus. Normal compressibility, respiratory phasicity and response to augmentation. Calf Veins: No evidence of thrombus. Normal compressibility and flow on color Doppler imaging. Superficial Great Saphenous Vein: No evidence of thrombus. Normal  compressibility. Venous Reflux:  None. Other Findings: No evidence of superficial thrombophlebitis or abnormal fluid collection. IMPRESSION: No evidence of right lower extremity deep venous  thrombosis. Electronically Signed   By: Aletta Edouard M.D.   On: 09/01/2022 17:09    Assessment and plan- Patient is a 56 y.o. female currently on AI for DCIS who presents to clinic for follow up for rash of feet. Unlikely related to AI and no improvement with holding medication. 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 Suspect triamcinolone cannot penetrate thick skin of feet. Will rotate to clobetasol, prefer foam but was not covered by insurance. Also give oral steroid pack given significant pain impeding her ability to work. Will refer to dermatology for further evaluation and management. She can continue AI and follow up with oncology as scheduled.    Visit Diagnosis 1. Rash of foot    Patient expressed understanding and was in agreement with this plan. She also understands that She can call clinic at any time with any questions, concerns, or complaints.   Thank you for allowing me to participate in the care of this very pleasant patient.   Beckey Rutter, DNP, AGNP-C [facility] [place]

## 2022-09-10 NOTE — Telephone Encounter (Signed)
Called patient 1000 am. Patient offered an apt at 1030 today with Lauren, pt declined apt this am as she is not ready to go out the door. She prefers afternoon. Apt given for 315 pm.

## 2022-09-10 NOTE — Progress Notes (Signed)
Patient here for oncology follow-up appointment, concerns of leg/ foot pain since Saturday gradually worsening, rash since Tuesday itchy, red and swelling. Patient limping in office & pain 10/10

## 2022-09-11 ENCOUNTER — Other Ambulatory Visit: Payer: Self-pay

## 2022-09-11 ENCOUNTER — Telehealth: Payer: Self-pay

## 2022-09-11 MED ORDER — CLOBETASOL PROPIONATE 0.05 % EX CREA: 1.0000 | TOPICAL_CREAM | Freq: Two times a day (BID) | CUTANEOUS | 0 refills | Status: DC

## 2022-09-11 NOTE — Telephone Encounter (Signed)
Cover my meds prior auth submitted for clobetasol foam ordered by Beckey Rutter. Key: [id number]

## 2022-09-17 ENCOUNTER — Ambulatory Visit (INDEPENDENT_AMBULATORY_CARE_PROVIDER_SITE_OTHER): Payer: BC Managed Care – PPO | Admitting: Dermatology

## 2022-09-17 DIAGNOSIS — R21 Rash and other nonspecific skin eruption: Secondary | ICD-10-CM

## 2022-09-17 DIAGNOSIS — B029 Zoster without complications: Secondary | ICD-10-CM

## 2022-09-17 MED ORDER — VALACYCLOVIR HCL 1 G PO TABS: 1000.0000 mg | ORAL_TABLET | Freq: Three times a day (TID) | ORAL | 0 refills | Status: AC

## 2022-09-17 NOTE — Progress Notes (Signed)
   New Patient Visit  Subjective  Jacqueline Murray is a 56 y.o. female who presents for the following: Rash (Patient here today for an sometimes painful and itchy rash at right foot and leg. Started 9/8 with pain that lasted for 1 week, then started breaking out. ).  Patient did go to Urgent Care 9/18 and was prescribed TMC but that did not help. She was put on prednisone taper and given clobetasol 9/21 by [facility] [place] where she is being treated for breast cancer. She has had some improvement on current treatment. Pain was so bad she was unable to walk on that foot and had to use crutches.   The following portions of the chart were reviewed this encounter and updated as appropriate:   Tobacco  Allergies  Meds  Problems  Med Hx  Surg Hx  Fam Hx      Review of Systems:  No other skin or systemic complaints except as noted in HPI or Assessment and Plan.  Objective  Well appearing patient in no apparent distress; mood and affect are within normal limits.  A focused examination was performed including right leg, foot and buttock. Relevant physical exam findings are noted in the Assessment and Plan.  Right Lower Leg and foot Clustered tense vesicles and erosions in a multi-dermatomal distribution at right foot       Assessment & Plan  Herpes zoster without complication Right Lower Leg and foot  Exam most suggestive of zoster or possibly HSV. No linear vesiculations or plaques to suggest allergic contact dermatitis  Start valacyclovir 1 g TID x 1 week with a glass of water  Discussed gabapentin for nerve pain if needed. Patient will advise if in 1-2 weeks if not doing better.   valACYclovir (VALTREX) 1000 MG tablet - Right Lower Leg and foot Take 1 tablet (1,000 mg total) by mouth 3 (three) times daily for 7 days. With full glass of water  Related Procedures HSV and VZV PCR Panel   Return if symptoms worsen or fail to improve.   Graciella Belton, RMA, am acting as  scribe for Forest Gleason, MD .  Documentation: I have reviewed the above documentation for accuracy and completeness, and I agree with the above.  Forest Gleason, MD

## 2022-09-17 NOTE — Patient Instructions (Signed)
Due to recent changes in healthcare laws, you may see results of your pathology and/or laboratory studies on MyChart before the doctors have had a chance to review them. We understand that in some cases there may be results that are confusing or concerning to you. Please understand that not all results are received at the same time and often the doctors may need to interpret multiple results in order to provide you with the best plan of care or course of treatment. Therefore, we ask that you please give us 2 business days to thoroughly review all your results before contacting the office for clarification. Should we see a critical lab result, you will be contacted sooner.   If You Need Anything After Your Visit  If you have any questions or concerns for your doctor, please call our main line at [phone] and press option 4 to reach your doctor's medical assistant. If no one answers, please leave a voicemail as directed and we will return your call as soon as possible. Messages left after 4 pm will be answered the following business day.   You may also send us a message via MyChart. We typically respond to MyChart messages within 1-2 business days.  For prescription refills, please ask your pharmacy to contact our office. Our fax number is [phone].  If you have an urgent issue when the clinic is closed that cannot wait until the next business day, you can page your doctor at the number below.    Please note that while we do our best to be available for urgent issues outside of office hours, we are not available 24/7.   If you have an urgent issue and are unable to reach us, you may choose to seek medical care at your doctor's office, retail clinic, urgent care center, or emergency room.  If you have a medical emergency, please immediately call 911 or go to the emergency department.  Pager Numbers  - Dr. Kowalski: [phone]  - Dr. Moye: [phone]  - Dr. Stewart:  [phone]  In the event of inclement weather, please call our main line at [phone] for an update on the status of any delays or closures.  Dermatology Medication Tips: Please keep the boxes that topical medications come in in order to help keep track of the instructions about where and how to use these. Pharmacies typically print the medication instructions only on the boxes and not directly on the medication tubes.   If your medication is too expensive, please contact our office at [phone] option 4 or send us a message through MyChart.   We are unable to tell what your co-pay for medications will be in advance as this is different depending on your insurance coverage. However, we may be able to find a substitute medication at lower cost or fill out paperwork to get insurance to cover a needed medication.   If a prior authorization is required to get your medication covered by your insurance company, please allow us 1-2 business days to complete this process.  Drug prices often vary depending on where the prescription is filled and some pharmacies may offer cheaper prices.  The website www.goodrx.com contains coupons for medications through different pharmacies. The prices here do not account for what the cost may be with help from insurance (it may be cheaper with your insurance), but the website can give you the price if you did not use any insurance.  - You can print the associated coupon and take it with   your prescription to the pharmacy.  - You may also stop by our office during regular business hours and pick up a GoodRx coupon card.  - If you need your prescription sent electronically to a different pharmacy, notify our office through [facility] MyChart or by phone at [phone] option 4.     Si Usted Necesita Algo Despus de Su Visita  Tambin puede enviarnos un mensaje a travs de MyChart. Por lo general respondemos a los mensajes de MyChart en el transcurso de 1 a 2  das hbiles.  Para renovar recetas, por favor pida a su farmacia que se ponga en contacto con nuestra oficina. Nuestro nmero de fax es el [phone].  Si tiene un asunto urgente cuando la clnica est cerrada y que no puede esperar hasta el siguiente da hbil, puede llamar/localizar a su doctor(a) al nmero que aparece a continuacin.   Por favor, tenga en cuenta que aunque hacemos todo lo posible para estar disponibles para asuntos urgentes fuera del horario de oficina, no estamos disponibles las 24 horas del da, los 7 das de la semana.   Si tiene un problema urgente y no puede comunicarse con nosotros, puede optar por buscar atencin mdica  en el consultorio de su doctor(a), en una clnica privada, en un centro de atencin urgente o en una sala de emergencias.  Si tiene una emergencia mdica, por favor llame inmediatamente al 911 o vaya a la sala de emergencias.  Nmeros de bper  - Dr. Kowalski: [phone]  - Dra. Moye: [phone]  - Dra. Stewart: [phone]  En caso de inclemencias del tiempo, por favor llame a nuestra lnea principal al [phone] para una actualizacin sobre el estado de cualquier retraso o cierre.  Consejos para la medicacin en dermatologa: Por favor, guarde las cajas en las que vienen los medicamentos de uso tpico para ayudarle a seguir las instrucciones sobre dnde y cmo usarlos. Las farmacias generalmente imprimen las instrucciones del medicamento slo en las cajas y no directamente en los tubos del medicamento.   Si su medicamento es muy caro, por favor, pngase en contacto con nuestra oficina llamando al [phone] y presione la opcin 4 o envenos un mensaje a travs de MyChart.   No podemos decirle cul ser su copago por los medicamentos por adelantado ya que esto es diferente dependiendo de la cobertura de su seguro. Sin embargo, es posible que podamos encontrar un medicamento sustituto a menor costo o llenar un formulario para que el  seguro cubra el medicamento que se considera necesario.   Si se requiere una autorizacin previa para que su compaa de seguros cubra su medicamento, por favor permtanos de 1 a 2 das hbiles para completar este proceso.  Los precios de los medicamentos varan con frecuencia dependiendo del lugar de dnde se surte la receta y alguna farmacias pueden ofrecer precios ms baratos.  El sitio web www.goodrx.com tiene cupones para medicamentos de diferentes farmacias. Los precios aqu no tienen en cuenta lo que podra costar con la ayuda del seguro (puede ser ms barato con su seguro), pero el sitio web puede darle el precio si no utiliz ningn seguro.  - Puede imprimir el cupn correspondiente y llevarlo con su receta a la farmacia.  - Tambin puede pasar por nuestra oficina durante el horario de atencin regular y recoger una tarjeta de cupones de GoodRx.  - Si necesita que su receta se enve electrnicamente a una farmacia diferente, informe a nuestra oficina a travs de MyChart de [facility]   o por telfono llamando al [phone] y presione la opcin 4.  

## 2022-09-18 ENCOUNTER — Encounter: Payer: Self-pay | Admitting: Dermatology

## 2022-09-21 ENCOUNTER — Telehealth: Payer: Self-pay

## 2022-09-21 LAB — HSV AND VZV PCR PANEL
HSV 2 DNA: NEGATIVE
HSV-1 DNA: NEGATIVE
Varicella-Zoster, PCR: POSITIVE — AB

## 2022-09-21 NOTE — Telephone Encounter (Signed)
Left msg for patient advising culture + for zoster, continue Valtrex as prescribed. Lurlean Horns., RMA

## 2022-09-21 NOTE — Telephone Encounter (Signed)
-----   Message from Alfonso Patten, MD sent at 09/21/2022  5:11 PM EDT ----- Zoster - continue valtrex as prescribed  MAs please call. Thank you!

## 2022-10-13 ENCOUNTER — Inpatient Hospital Stay: Payer: BC Managed Care – PPO | Attending: Oncology

## 2022-10-13 ENCOUNTER — Inpatient Hospital Stay (HOSPITAL_BASED_OUTPATIENT_CLINIC_OR_DEPARTMENT_OTHER): Payer: BC Managed Care – PPO | Admitting: Oncology

## 2022-10-13 ENCOUNTER — Encounter: Payer: Self-pay | Admitting: Oncology

## 2022-10-13 VITALS — BP 126/83 | HR 86 | Temp 98.7°F | Wt 133.0 lb

## 2022-10-13 DIAGNOSIS — D0511 Intraductal carcinoma in situ of right breast: Secondary | ICD-10-CM

## 2022-10-13 DIAGNOSIS — Z79811 Long term (current) use of aromatase inhibitors: Secondary | ICD-10-CM | POA: Diagnosis not present

## 2022-10-13 DIAGNOSIS — Z170 Estrogen receptor positive status [ER+]: Secondary | ICD-10-CM | POA: Insufficient documentation

## 2022-10-13 DIAGNOSIS — M8580 Other specified disorders of bone density and structure, unspecified site: Secondary | ICD-10-CM | POA: Insufficient documentation

## 2022-10-13 LAB — COMPREHENSIVE METABOLIC PANEL
ALT: 26 U/L (ref 0–44)
AST: 26 U/L (ref 15–41)
Albumin: 4.1 g/dL (ref 3.5–5.0)
Alkaline Phosphatase: 56 U/L (ref 38–126)
Anion gap: 6 (ref 5–15)
BUN: 12 mg/dL (ref 6–20)
CO2: 27 mmol/L (ref 22–32)
Calcium: 9.4 mg/dL (ref 8.9–10.3)
Chloride: 105 mmol/L (ref 98–111)
Creatinine, Ser: 0.98 mg/dL (ref 0.44–1.00)
GFR, Estimated: >60 mL/min (ref >60)
Glucose, Bld: 104 mg/dL — ABNORMAL HIGH (ref 70–99)
Potassium: 3.9 mmol/L (ref 3.5–5.1)
Sodium: 138 mmol/L (ref 135–145)
Total Bilirubin: 0.7 mg/dL (ref 0.3–1.2)
Total Protein: 7.7 g/dL (ref 6.5–8.1)

## 2022-10-13 LAB — CBC WITH DIFFERENTIAL/PLATELET
Abs Immature Granulocytes: 0.02 10*3/uL (ref 0.00–0.07)
Basophils Absolute: 0 10*3/uL (ref 0.0–0.1)
Basophils Relative: 1 %
Eosinophils Absolute: 0.1 10*3/uL (ref 0.0–0.5)
Eosinophils Relative: 2 %
HCT: 38.5 % (ref 36.0–46.0)
Hemoglobin: 12.2 g/dL (ref 12.0–15.0)
Immature Granulocytes: 0 %
Lymphocytes Relative: 23 %
Lymphs Abs: 1.6 10*3/uL (ref 0.7–4.0)
MCH: 28.8 pg (ref 26.0–34.0)
MCHC: 31.7 g/dL (ref 30.0–36.0)
MCV: 90.8 fL (ref 80.0–100.0)
Monocytes Absolute: 0.7 10*3/uL (ref 0.1–1.0)
Monocytes Relative: 11 %
Neutro Abs: 4.4 10*3/uL (ref 1.7–7.7)
Neutrophils Relative %: 63 %
Platelets: 259 10*3/uL (ref 150–400)
RBC: 4.24 MIL/uL (ref 3.87–5.11)
RDW: 13.6 % (ref 11.5–15.5)
WBC: 6.9 10*3/uL (ref 4.0–10.5)
nRBC: 0 % (ref 0.0–0.2)

## 2022-10-13 MED ORDER — ANASTROZOLE 1 MG PO TABS: 1.0000 mg | ORAL_TABLET | Freq: Every day | ORAL | 1 refills | Status: DC

## 2022-10-13 NOTE — Assessment & Plan Note (Addendum)
#  Right breast DCIS, ER positive. 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 Continue Arimidex '1mg'$  daily. Rx sent Obtain annual mammogram in March 2024

## 2022-10-13 NOTE — Progress Notes (Signed)
Hematology/Oncology Progress note [phone] [phone]            Patient Care Team: Leonel Ramsay, MD as PCP - General (Infectious Diseases) Theodore Demark, RN (Inactive) as Oncology Nurse Navigator Herbert Pun, MD as Consulting Physician (General Surgery) Earlie Server, MD as Consulting Physician (Oncology) Noreene Filbert, MD as Consulting Physician (Radiation Oncology)  ASSESSMENT & PLAN:   Cancer Staging  Ductal carcinoma in situ (DCIS) of right breast Staging form: Breast, AJCC 8th Edition - Pathologic stage from 05/21/2022: Stage 0 (pTis (DCIS), pN0, cM0, G1, ER+, PR: Not Assessed, HER2: Not Assessed) - Signed by Earlie Server, MD on 05/21/2022   Ductal carcinoma in situ (DCIS) of right breast #Right breast DCIS, ER positive. 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 Continue Arimidex 39m daily. Rx sent Obtain annual mammogram in March 2024  Aromatase inhibitor use Continue calcium and vitamin D supplementation.  History of osteopenia ][2019], 08/07/22 DEXA normal bone density   Orders Placed This Encounter  Procedures   MM DIAG BREAST TOMO BILATERAL    Standing Status:   Future    Standing Expiration Date:   10/14/2023    Order Specific Question:   Reason for Exam (SYMPTOM  OR DIAGNOSIS REQUIRED)    Answer:   BREAST CANCER SURVEILLANCE    Order Specific Question:   Is the patient pregnant?    Answer:   No    Order Specific Question:   Preferred imaging location?    Answer:   [facility]   [place]    Standing Status:   Future    Standing Expiration Date:   10/14/2023    Order Specific Question:   Reason for Exam (SYMPTOM  OR DIAGNOSIS REQUIRED)    Answer:   DCIS    Order Specific Question:   Preferred imaging location?    Answer:   [facility]   [place]     Standing Status:   Future    Standing Expiration Date:   10/14/2023    Order Specific Question:   Reason for Exam (SYMPTOM  OR DIAGNOSIS REQUIRED)    Answer:   DCIS    Order Specific Question:   Preferred imaging location?    Answer:   [facility]   CBC with Differential/Platelet    Standing Status:   Future    Standing Expiration Date:   10/14/2023   Comprehensive metabolic panel    Standing Status:   Future    Standing Expiration Date:   10/14/2023   Follow up in  6 months.  All questions were answered. The patient knows to call the clinic with any problems, questions or concerns.  ZEarlie Server MD, PhD [facility] Oncology 10/13/2022   [name] MD  CHIEF COMPLAINTS/REASON FOR VISIT:   Right breast DCIS  HISTORY OF PRESENTING ILLNESS:   Jacqueline FORESTAis a  56y.o.  female presents for follow up of DCIS right breast  Patient has a history of papilloma in 2021. 04/29/2020, bilateral breast MRI showed suspicious clumped non-mass enhancement at 12:00 in the right breast, and an indeterminate suspicious mass at the 9:00 in the right breast. 05/17/2020, 9:00 breast mass positive for papilloma with usual ductal hyperplasia.  12:00 mass was benign. 06/05/2020, right breast excisional biopsy showed benign breast tissue with intraductal papilloma, incidental fibroadenoma,  fibrocystic changes, usual ductal hyperplasia, prior biopsy changes.  Negative for atypia and malignancy.  Right breast ductal excisional biopsy was negative for atypia and malignancy.  Patient complains of history of bloody right nipple discharge.  History of papilloma excised in 2021.  She also has a left breast mass was originally seen on 02/12/2020 study and being watched. 02/25/2022, bilateral breast mammogram showed Suspicious calcifications in the upper outer quadrant of the right breast; intra ductal mass in the 3:00 region of the right breast 3 cm from nipple.;  Stable benign-appearing mass in  the 3: 30 region of the left breast.  Sonographic evaluation of the right axilla does not show any enlarged adenopathy.  03/09/2022, patient underwent right breast 3:00 3 cm from the nipple, ultrasound-guided biopsy, pathology showed fragment of intraductal papilloma, with associated usual ductal hyperplasia.  Background mammary parenchyma with fibrocystic changes negative for atypical proliferative breast disease.;  Right upper outer quadrant calcification stereotactic core needle biopsy showed DCIS, high-grade with comedonecrosis.  Calcifications associated with DCIS.  Negative for invasive mammary carcinoma.  Patient has been seen by Dr. Peyton Najjar.  Recommended bilateral MRI for further evaluation of the extent of DCIS 03/20/2022, bilateral breast MRI 1. Large area of non mass enhancement within the superior central right breast adjacent to the marking clip, potentially representing additional DCIS adjacent to the recent site of biopsy. 2. Multiple enhancing masses are demonstrated throughout the right and left breast. These are indeterminate in etiology however may represent a background of papillomatosis. Additional etiologies not excluded. 3. Cortically thickened right axillary lymph node, indeterminate.  Patient was referred to establish care with oncology for further evaluation management.  Patient has no new complaints today.  Some soreness at the site of previous biopsy. Family history is positive for father with prostate cancer.  No family history of breast cancer.  Menarche 56 years of age Age at first birth 33, she has 1 daughter. Patient has taken OCP 20+ years. History of hysterectomy. + Hormone replacement therapy since 2016 /2017, recently stopped Previous right breast excisional biopsy-papilloma  03/20/2022, bilateral breast MRI showed additional non-mass enhancement adjacent to the biopsy area, as well as multiple enhancing masses throughout the right and left breast.  Cortically  thickened right axillary lymph node. Patient will get additional biopsy-MRI biopsy of the non-mass enhancement within the superior central right breast, dominant mass within the lower outer right breast, and dominant mass in the left breast.  Ultrasound for second look at the axillary lymph node.  04/09/22, additional fine-needle biopsy right breast superior central core needle biopsy showed usual ductal hyperplasia, columnar cells and fibrocystic changes.  Fat necrosis and foreign body giant cell reaction.  No malignancy identified. Right breast lower outer needle biopsy showed usual ductal hyperplasia and fibrocystic changes.  No malignancy identified. Left breast lower outer needle biopsy showed usual ductal hyperplasia and columnar cell and fibrocystic changes.  No malignancy identified. All were found to be concordant by Dr. Lovey Newcomer.  04/30/2022, second look right axilla showed no suspicious right axillary lymphadenopathy.  Biopsy was canceled  05/06/2022, patient underwent lumpectomy. Right breast partial mastectomy showed a single foci of low-grade DCIS in situ, 2.5 mm, completely excised.  Single separated focus of atypical ductal hyperplasia.  Background benign mammary parenchyma with intraductal papilloma, focally sclerosing, fibrocystic changes, and florid usual ductal hyperplasia.  Negative for malignancy and residual high-grade ductal carcinoma in situ.  Sentinel lymph node biopsy showed 4 lymph nodes all negative for malignancy.  Additional right  breast excision showed benign mammary parenchyma with intraductal papilloma, associated florid usual ductal hyperplasia.  Negative for atypical perforated breast disease. pTis pN0, estrogen receptor 90% positive.  #Family history of cancer, Patient has establish care with genetic counselor and had [employer] done.  No pathological mutations.she has PALB2 VUS.   06/04/2022 - 06/26/2022 status post adjuvant radiation 07/13/22 started on  Arimidex   INTERVAL HISTORY Jacqueline Murray is a 56 y.o. female who has above history reviewed by me today presents for follow up visit for management of right breast DCIS She has started on Arimidex since last visit. Manageable side effects.  08/07/22 DEXA normal bone density  Review of Systems  Constitutional:  Negative for appetite change, chills, fatigue and fever.  HENT:   Negative for hearing loss and voice change.   Eyes:  Negative for eye problems.  Respiratory:  Negative for chest tightness and cough.   Cardiovascular:  Negative for chest pain.  Gastrointestinal:  Negative for abdominal distention, abdominal pain and blood in stool.  Endocrine: Negative for hot flashes.  Genitourinary:  Negative for difficulty urinating and frequency.   Musculoskeletal:  Negative for arthralgias.  Skin:  Negative for itching and rash.  Neurological:  Negative for extremity weakness.  Hematological:  Negative for adenopathy.  Psychiatric/Behavioral:  Negative for confusion.     MEDICAL HISTORY:  Past Medical History:  Diagnosis Date   Anemia    Colon polyp 80/32/1224   Complication of anesthesia    Ductal carcinoma in situ (DCIS) of right breast    GERD (gastroesophageal reflux disease)    Headache    HLD (hyperlipidemia)    Hypertension    IBS (irritable bowel syndrome)    Intraductal papilloma of breast 01/21/2012   left breast   Intraductal papilloma of breast, right    PONV (postoperative nausea and vomiting)    EXTREME NAUSEA    SURGICAL HISTORY: Past Surgical History:  Procedure Laterality Date   ABDOMINAL HYSTERECTOMY     BREAST BIOPSY Bilateral few yrs ago   benign-cyst   BREAST BIOPSY Right 1986   fibroadenoma   BREAST BIOPSY Right 05/17/2020   neg/ MRI bx 2 areas   BREAST BIOPSY Right 03/09/2022   rt breast stereo calcs x clip path pending   BREAST BIOPSY WITH RADIO FREQUENCY LOCALIZER Right 06/05/2020   Procedure: BREAST BIOPSY WITH RADIO FREQUENCY LOCALIZER;   Surgeon: Herbert Pun, MD;  Location: [facility] ORS;  Service: General;  Laterality: Right;   breast biposy Right 03/09/2022   u/s core bc heart clip path pending   BREAST EXCISIONAL BIOPSY Right 05/26/2020   neg   BREAST EXCISIONAL BIOPSY Left 2013   neg   CESAREAN SECTION     COLONOSCOPY     PARTIAL MASTECTOMY WITH AXILLARY SENTINEL LYMPH NODE BIOPSY Right 05/06/2022   Procedure: PARTIAL MASTECTOMY WITH AXILLARY SENTINEL LYMPH NODE BIOPSY With RF tag;  Surgeon: Herbert Pun, MD;  Location: [facility] ORS;  Service: General;  Laterality: Right;    SOCIAL HISTORY: Social History   Socioeconomic History   Marital status: Divorced    Spouse name: Not on file   Number of children: Not on file   Years of education: Not on file   Highest education level: Not on file  Occupational History   Not on file  Tobacco Use   Smoking status: Never    Passive exposure: Never   Smokeless tobacco: Never  Vaping Use   Vaping Use: Never used  Substance and Sexual  Activity   Alcohol use: Not Currently   Drug use: Never   Sexual activity: Not Currently  Other Topics Concern   Not on file  Social History Narrative   Not on file   Social Determinants of Health   Financial Resource Strain: Not on file  Food Insecurity: Not on file  Transportation Needs: Not on file  Physical Activity: Not on file  Stress: Not on file  Social Connections: Not on file  Intimate Partner Violence: Not on file    FAMILY HISTORY: Family History  Problem Relation Age of Onset   Diabetes Mellitus II Mother    Heart disease Mother    Prostate cancer Father 47       metastatic   Hypertension Father    CAD Father    Hypertension Sister    Hyperlipidemia Sister    CAD Brother    Diabetes Mellitus II Brother    Heart disease Brother    Kidney disease Brother     ALLERGIES:  is allergic to ace inhibitors, latex, povidone iodine, shellfish-derived products, and tape.  MEDICATIONS:  Current  Outpatient Medications  Medication Sig Dispense Refill   Calcium Carbonate (CALTRATE 600 PO) Take 600 mg by mouth daily.     ibuprofen (ADVIL) 400 MG tablet Take 400 mg by mouth every 6 (six) hours as needed.     loratadine (CLARITIN) 10 MG tablet Take 10 mg by mouth at bedtime.     montelukast (SINGULAIR) 10 MG tablet Take 10 mg by mouth at bedtime.     Multiple Vitamins-Minerals (MULTIVITAMIN ADULT, MINERALS,) TABS Take 1 tablet by mouth at bedtime.     simvastatin (ZOCOR) 20 MG tablet Take 20 mg by mouth at bedtime.     spironolactone (ALDACTONE) 100 MG tablet Take 100 mg by mouth daily.     triamcinolone (NASACORT) 55 MCG/ACT AERO nasal inhaler Place 2 sprays into the nose daily.      anastrozole (ARIMIDEX) 1 MG tablet Take 1 tablet (1 mg total) by mouth daily. 90 tablet 1   Aspirin-Salicylamide-Caffeine (ARTHRITIS STRENGTH BC POWDER PO) Take by mouth. (Patient not taking: Reported on 10/13/2022)     clobetasol (OLUX) 0.05 % topical foam Apply topically 2 (two) times daily. (Patient not taking: Reported on 10/13/2022) 50 g 0   clobetasol cream (TEMOVATE) 3.53 % Apply 1 Application topically 2 (two) times daily. (Patient not taking: Reported on 10/13/2022) 30 g 0   omeprazole (PRILOSEC) 20 MG capsule Take 20 mg by mouth daily as needed (Heartburn). (Patient not taking: Reported on 10/13/2022)     predniSONE (STERAPRED UNI-PAK 21 TAB) 10 MG (21) TBPK tablet Day 1: 2 tab before breakfast, 1 after lunch, 1 after dinner, & 2 at bedtime Day 2: 1 tab before breakfast, 1 after lunch, 1 after dinner, & 2 at bedtime Day 3: 1 tab before breakfast, 1 after lunch, 1 after dinner, & 1 at bedtime Day 4: 1 tab before breakfast, 1 after lunch, & 1 at bedtime Day 5: 1 tab before breakfast & 1 at bedtime Day 6: 1 tablet before breakfast (Patient not taking: Reported on 10/13/2022) 21 tablet 0   triamcinolone cream (KENALOG) 0.1 % Apply 1 Application topically 2 (two) times daily. (Patient not taking: Reported on  10/13/2022) 30 g 0   No current facility-administered medications for this visit.     PHYSICAL EXAMINATION: ECOG PERFORMANCE STATUS: 0 - Asymptomatic Vitals:   10/13/22 1432  BP: 126/83  Pulse: 86  Temp: 98.7 F (  37.1 C)  SpO2: 100%   Filed Weights   10/13/22 1432  Weight: 133 lb (60.3 kg)    Physical Exam Constitutional:      General: She is not in acute distress. HENT:     Head: Normocephalic and atraumatic.  Eyes:     General: No scleral icterus. Cardiovascular:     Rate and Rhythm: Normal rate and regular rhythm.     Heart sounds: Normal heart sounds.  Pulmonary:     Effort: Pulmonary effort is normal. No respiratory distress.     Breath sounds: No wheezing.  Abdominal:     General: Bowel sounds are normal. There is no distension.     Palpations: Abdomen is soft.  Musculoskeletal:        General: No deformity. Normal range of motion.     Cervical back: Normal range of motion and neck supple.  Skin:    General: Skin is warm and dry.     Findings: No erythema or rash.  Neurological:     Mental Status: She is alert and oriented to person, place, and time. Mental status is at baseline.     Cranial Nerves: No cranial nerve deficit.     Coordination: Coordination normal.  Psychiatric:        Mood and Affect: Mood normal.      LABORATORY DATA:  I have reviewed the data as listed    Latest Ref Rng & Units 10/13/2022    2:06 PM 07/01/2022    8:34 AM 06/18/2022    8:31 AM  CBC  WBC 4.0 - 10.5 K/uL 6.9  4.8  6.0   Hemoglobin 12.0 - 15.0 g/dL 12.2  12.0  12.3   Hematocrit 36.0 - 46.0 % 38.5  38.2  38.0   Platelets 150 - 400 K/uL 259  198  200       Latest Ref Rng & Units 10/13/2022    2:06 PM 07/01/2022    8:34 AM 03/25/2022   10:46 AM  CMP  Glucose 70 - 99 mg/dL 104  107  93   BUN 6 - 20 mg/dL 12  15  18    Creatinine 0.44 - 1.00 mg/dL 0.98  0.88  0.86   Sodium 135 - 145 mmol/L 138  137  135   Potassium 3.5 - 5.1 mmol/L 3.9  4.3  4.0   Chloride 98 - 111  mmol/L 105  110  105   CO2 22 - 32 mmol/L 27  23  26    Calcium 8.9 - 10.3 mg/dL 9.4  9.3  9.2   Total Protein 6.5 - 8.1 g/dL 7.7  7.4  7.5   Total Bilirubin 0.3 - 1.2 mg/dL 0.7  1.1  1.3   Alkaline Phos 38 - 126 U/L 56  44  41   AST 15 - 41 U/L 26  37  25   ALT 0 - 44 U/L 26  45  22      RADIOGRAPHIC STUDIES: I have personally reviewed the radiological images as listed and agreed with the findings in the report. No results found.

## 2022-10-13 NOTE — Assessment & Plan Note (Addendum)
Continue calcium and vitamin D supplementation.  History of osteopenia ][2019], 08/07/22 DEXA normal bone density

## 2022-10-13 NOTE — Progress Notes (Signed)
Survivorship Care Plan visit completed.  Treatment summary reviewed and given to patient.  ASCO answers booklet reviewed and given to patient.  CARE program and Cancer Transitions discussed with patient along with other resources cancer center offers to patients and caregivers.  Patient verbalized understanding.    

## 2022-11-03 ENCOUNTER — Other Ambulatory Visit: Payer: Self-pay | Admitting: Dermatology

## 2022-11-03 ENCOUNTER — Other Ambulatory Visit: Payer: Self-pay

## 2022-11-03 ENCOUNTER — Encounter: Payer: Self-pay | Admitting: Dermatology

## 2022-11-03 DIAGNOSIS — B0229 Other postherpetic nervous system involvement: Secondary | ICD-10-CM

## 2022-11-03 MED ORDER — GABAPENTIN (ONCE-DAILY) 300 MG PO TABS: ORAL_TABLET | ORAL | 2 refills | Status: DC

## 2022-11-03 NOTE — Telephone Encounter (Signed)
Please send in gabapentin 300 mg tablets (the ones on formulary). Take one by mouth day 1, one by mouth twice a day on day 2, and one by mouth 3 times a day after that if tolerated. Caution sedation or dizziness. Do not stop suddenly - decrease by one tablet every 3 days when stopping. Dispense 90, 2 refills.  Please counsel she can hold at once or twice per day if she is getting too sleepy with it. Sometimes this can cause headaches. Rarely, people can have an allergy or more significant reaction.

## 2022-11-03 NOTE — Progress Notes (Signed)
Gabapentin '300mg'$ : Take one by mouth day 1, one by mouth twice a day on day 2, and one by mouth 3 times a day after that if tolerated. Caution sedation or dizziness. Do not stop suddenly - decrease by one tablet every 3 days when stopping. Dispense 90, 2 refills. JP

## 2023-01-18 ENCOUNTER — Encounter: Payer: Self-pay | Admitting: Radiation Oncology

## 2023-01-18 ENCOUNTER — Ambulatory Visit
Admission: RE | Admit: 2023-01-18 | Discharge: 2023-01-18 | Disposition: A | Discharge status: Home / Self Care | Payer: BC Managed Care – PPO | Source: Ambulatory Visit | Attending: Radiation Oncology | Admitting: Radiation Oncology

## 2023-01-18 VITALS — BP 124/83 | HR 79 | Temp 98.0°F | Resp 16 | Ht 64.0 in | Wt 133.6 lb

## 2023-01-18 DIAGNOSIS — Z923 Personal history of irradiation: Secondary | ICD-10-CM | POA: Insufficient documentation

## 2023-01-18 DIAGNOSIS — D0511 Intraductal carcinoma in situ of right breast: Secondary | ICD-10-CM | POA: Insufficient documentation

## 2023-01-18 DIAGNOSIS — Z170 Estrogen receptor positive status [ER+]: Secondary | ICD-10-CM | POA: Diagnosis not present

## 2023-01-18 DIAGNOSIS — Z79811 Long term (current) use of aromatase inhibitors: Secondary | ICD-10-CM | POA: Insufficient documentation

## 2023-01-18 NOTE — Progress Notes (Signed)
Radiation Oncology Follow up Note  Name: Jacqueline Murray   Date:   01/18/2023 MRN:  [id number] DOB: Sep 27, 1966    This 57 y.o. female presents to the clinic today for 53-monthfollow-up status post whole breast radiation to her right breast for stage 0 ductal carcinoma in situ ER positive.  REFERRING PROVIDER: [name] MD  HPI: Patient is a 57year old female now out 6 months, completed whole breast radiation to her right breast for ER positive ductal carcinoma in situ.  Seen today in routine follow-up she is doing well.  She specifically denies breast tenderness cough or bone pain..  She is currently on Arimidex tolerating her treatments well.  She is scheduled for follow-up imaging in March.  COMPLICATIONS OF TREATMENT: none  FOLLOW UP COMPLIANCE: keeps appointments   PHYSICAL EXAM:  BP 124/83 (BP Location: Left Arm, Patient Position: Sitting, Cuff Size: Normal)   Pulse 79   Temp 98 F (36.7 C) (Tympanic)   Resp 16   Ht '5\' 4"'$  (1.626 m) Comment: stated HT  Wt 133 lb 9.6 oz (60.6 kg)   BMI 22.93 kg/m  Lungs are clear to A&P cardiac examination essentially unremarkable with regular rate and rhythm. No dominant mass or nodularity is noted in either breast in 2 positions examined. Incision is well-healed. No axillary or supraclavicular adenopathy is appreciated. Cosmetic result is excellent.  Well-developed well-nourished patient in NAD. HEENT reveals PERLA, EOMI, discs not visualized.  Oral cavity is clear. No oral mucosal lesions are identified. Neck is clear without evidence of cervical or supraclavicular adenopathy. Lungs are clear to A&P. Cardiac examination is essentially unremarkable with regular rate and rhythm without murmur rub or thrill. Abdomen is benign with no organomegaly or masses noted. Motor sensory and DTR levels are equal and symmetric in the upper and lower extremities. Cranial nerves II through XII are grossly intact. Proprioception is intact. No peripheral  adenopathy or edema is identified. No motor or sensory levels are noted. Crude visual fields are within normal range.  RADIOLOGY RESULTS: No current films for review  PLAN: Present patient is doing well 6 months out from whole breast radiation and pleased with her overall progress.  She continues on Arimidex without side effect.  I asked to see her back in 6 months for follow-up and then will start once year visits.  She will have mammograms prior to the next visit.  Patient is to call with any concerns.  I would like to take this opportunity to thank you for allowing me to participate in the care of your patient..Noreene Filbert MD

## 2023-03-01 ENCOUNTER — Ambulatory Visit
Admission: RE | Admit: 2023-03-01 | Discharge: 2023-03-01 | Disposition: A | Discharge status: Home / Self Care | Payer: BC Managed Care – PPO | Source: Ambulatory Visit | Attending: Oncology | Admitting: Oncology

## 2023-03-01 ENCOUNTER — Other Ambulatory Visit: Payer: Self-pay | Admitting: Oncology

## 2023-03-01 DIAGNOSIS — D0511 Intraductal carcinoma in situ of right breast: Secondary | ICD-10-CM

## 2023-03-01 DIAGNOSIS — R928 Other abnormal and inconclusive findings on diagnostic imaging of breast: Secondary | ICD-10-CM

## 2023-03-01 DIAGNOSIS — N63 Unspecified lump in unspecified breast: Secondary | ICD-10-CM

## 2023-03-03 ENCOUNTER — Ambulatory Visit
Admission: RE | Admit: 2023-03-03 | Discharge: 2023-03-03 | Disposition: A | Discharge status: Home / Self Care | Payer: BC Managed Care – PPO | Source: Ambulatory Visit | Attending: Oncology | Admitting: Oncology

## 2023-03-03 DIAGNOSIS — N63 Unspecified lump in unspecified breast: Secondary | ICD-10-CM

## 2023-03-03 DIAGNOSIS — R928 Other abnormal and inconclusive findings on diagnostic imaging of breast: Secondary | ICD-10-CM

## 2023-03-03 HISTORY — PX: BREAST BIOPSY: SHX20

## 2023-03-03 MED ORDER — LIDOCAINE HCL (PF) 1 % IJ SOLN: 10.0000 mL | Freq: Once | INTRAMUSCULAR | Status: DC

## 2023-03-10 ENCOUNTER — Ambulatory Visit: Payer: Self-pay | Admitting: General Surgery

## 2023-03-10 NOTE — H&P (Signed)
HISTORY OF PRESENT ILLNESS:    Ms. 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  Patient has history of right breast DCIS.  She was treated with partial mastectomy.  The core biopsy showed high-grade DCIS with comedonecrosis but excisional biopsy shows DCIS low-grade.  She also has sentinel lymph node biopsy was negative for malignancy.  At the same time she had an excisional biopsy of the second lesion on the same breast which came back with intraductal papilloma without atypia.  Now patient presenting with constant nipple discharge.  Nipple discharge described as clear yellow.  No bloody discharge.  No purulence.  No white discharge.  No significant pain.  No pain radiation.  No alleviating or aggravating factors.  Patient had diagnostic mammogram and ultrasound on 03/01/2023.  She was found with mass measuring 5 x 4 x 3 mm on the right nipple with focally dilated duct.  Breast radiologist attempted to do biopsy of this mass but due to to being extremely superficial within the nipple it was found to not be safe.  They recommended surgical excision.      PAST MEDICAL HISTORY:  Past Medical History:  Diagnosis Date   Allergy 2004   Anemia 2000   Arthritis    GERD (gastroesophageal reflux disease) 2004   History of blood transfusion 2000   Hyperlipidemia    Hypertension 2005        PAST SURGICAL HISTORY:   Past Surgical History:  Procedure Laterality Date   CESAREAN SECTION  06/17/1994   COLONOSCOPY  03/13/2010   @ [facility] - "benign polyps"   HYSTERECTOMY  01/28/2015   BREAST EXCISIONAL BIOPSY Right 06/05/2020   Dr Lesli Albee   COLONOSCOPY  09/25/2020   Normal colon/PHx CP/Repeat 65yrs/[facility]   MASTECTOMY PARTIAL Right 05/06/2022   Dr Lesli Albee -- w/ SN & RF         MEDICATIONS:  Outpatient Encounter Medications as of 03/09/2023  Medication Sig Dispense Refill   ALPRAZolam (XANAX) 0.5 MG tablet      anastrozole  (ARIMIDEX) 1 mg tablet Take 1 mg by mouth once daily     calcium carbonate (CALTRATE 600 ORAL) Take by mouth     loratadine (CLARITIN) 10 mg tablet Take 10 mg by mouth once daily       montelukast (SINGULAIR) 10 mg tablet Take 1 tablet (10 mg total) by mouth at bedtime 90 tablet 1   multivit/folic acid/vit K1 (ONE-A-DAY WOMEN'S 50 PLUS ORAL) Take 1 tablet by mouth once daily     omeprazole (PRILOSEC) 20 MG DR capsule Take 20 mg by mouth once daily     simvastatin (ZOCOR) 20 MG tablet TAKE 1 TABLET(20 MG) BY MOUTH AT BEDTIME 90 tablet 1   spironolactone (ALDACTONE) 100 MG tablet TAKE 1 TABLET(100 MG) BY MOUTH EVERY DAY 90 tablet 3   triamcinolone (NASACORT AQ) 55 mcg nasal spray as needed     estradioL (ESTRACE) 1 MG tablet TAKE 1 TABLET(1 MG) BY MOUTH EVERY DAY 90 tablet 2   No facility-administered encounter medications on file as of 03/09/2023.     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  Tobacco Use   Smoking status: Never   Smokeless tobacco: Never  Vaping Use   Vaping Use: Never used  Substance and Sexual Activity   Alcohol use: Not Currently   Drug use: Never   Sexual activity: Not Currently    Partners: Male    Birth control/protection: None    FAMILY HISTORY:  Family History  Problem Relation Age of Onset   Heart disease Mother    Diabetes type I Mother    Coronary Artery Disease (Blocked arteries around heart) Mother        Deceased   Diabetes type II Mother        Deceased   Diabetes Mother    Prostate cancer Father        Deceased   High blood pressure (Hypertension) Father        Deceased   Hyperlipidemia  (Elevated cholesterol) Father        Deceased   High blood pressure (Hypertension) Sister    Hyperlipidemia (Elevated cholesterol) Sister    Heart disease Brother    Diabetes type I Brother    Kidney disease Brother    Coronary Artery Disease (Blocked arteries around heart) Brother        Deceased   Diabetes type II Brother        Deceased   Diabetes Brother    No Known Problems Brother    No Known Problems Brother    No Known Problems Brother      GENERAL REVIEW OF SYSTEMS:   General ROS: negative for - chills, fatigue, fever, weight gain or weight loss Allergy and Immunology ROS: negative for - hives  Hematological and Lymphatic ROS: negative for - bleeding problems or bruising, negative for palpable nodes Endocrine ROS: negative for - heat or cold intolerance, hair changes Respiratory ROS: negative for - cough, shortness of breath or wheezing Cardiovascular ROS: no chest pain or palpitations GI ROS: negative for nausea, vomiting, abdominal pain, diarrhea, constipation Musculoskeletal ROS: negative for - joint swelling or muscle pain Neurological ROS: negative for - confusion, syncope Dermatological ROS: negative for pruritus and rash  PHYSICAL EXAM:  Vitals:   03/09/23 0935  BP: 115/81  Pulse: 84  .  Ht:157.5 cm (5' 2.01") Wt:56.7 kg (125 lb) [id number] surface area is 1.58 meters squared. Body mass index is 22.86 kg/m.Marland Kitchen   GENERAL: Alert, active, oriented x3  BREAST:   EXTREMITIES: Well-developed well-nourished symmetrical with no dependent edema.  NEUROLOGICAL: Awake alert oriented, facial expression symmetrical, moving all extremities.      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   No high-grade DCIS was found on final pathology. -Status post excisional biopsy and sentinel node biopsy -Patient completed adjuvant radiation therapy -Patient currently on Arimidex      PLAN:  1.  Right breast excisional biopsy (19120) 2.  Avoid taking aspirin 5 days before the procedure 3.  Contact us if you have any concern  Patient verbalized understanding, all questions were answered, and were agreeable with the plan outlined above.   Herbert Pun, MD  Electronically signed by Herbert Pun, MD

## 2023-03-10 NOTE — H&P (View-Only) (Signed)
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  Patient has history of right breast DCIS.  She was treated with partial mastectomy.  The core biopsy showed high-grade DCIS with comedonecrosis but excisional biopsy shows DCIS low-grade.  She also has sentinel lymph node biopsy was negative for malignancy.  At the same time she had an excisional biopsy of the second lesion on the same breast which came back with intraductal papilloma without atypia.  Now patient presenting with constant nipple discharge.  Nipple discharge described as clear yellow.  No bloody discharge.  No purulence.  No white discharge.  No significant pain.  No pain radiation.  No alleviating or aggravating factors.  Patient had diagnostic mammogram and ultrasound on 03/01/2023.  She was found with mass measuring 5 x 4 x 3 mm on the right nipple with focally dilated duct.  Breast radiologist attempted to do biopsy of this mass but due to to being extremely superficial within the nipple it was found to not be safe.  They recommended surgical excision.      PAST MEDICAL HISTORY:  Past Medical History:  Diagnosis Date   Allergy 2004   Anemia 2000   Arthritis    GERD (gastroesophageal reflux disease) 2004   History of blood transfusion 2000   Hyperlipidemia    Hypertension 2005        PAST SURGICAL HISTORY:   Past Surgical History:  Procedure Laterality Date   CESAREAN SECTION  06/17/1994   COLONOSCOPY  03/13/2010   @ [facility] - "benign polyps"   HYSTERECTOMY  01/28/2015   BREAST EXCISIONAL BIOPSY Right 06/05/2020   Dr Kamron Portee Cintron   COLONOSCOPY  09/25/2020   Normal colon/PHx CP/Repeat 5yrs/[facility]   MASTECTOMY PARTIAL Right 05/06/2022   Dr Zykiria Bruening Cintron -- w/ SN & RF         MEDICATIONS:  Outpatient Encounter Medications as of 03/09/2023  Medication Sig Dispense Refill   ALPRAZolam (XANAX) 0.5 MG tablet      anastrozole  (ARIMIDEX) 1 mg tablet Take 1 mg by mouth once daily     calcium carbonate (CALTRATE 600 ORAL) Take by mouth     loratadine (CLARITIN) 10 mg tablet Take 10 mg by mouth once daily       montelukast (SINGULAIR) 10 mg tablet Take 1 tablet (10 mg total) by mouth at bedtime 90 tablet 1   multivit/folic acid/vit K1 (ONE-A-DAY WOMEN'S 50 PLUS ORAL) Take 1 tablet by mouth once daily     omeprazole (PRILOSEC) 20 MG DR capsule Take 20 mg by mouth once daily     simvastatin (ZOCOR) 20 MG tablet TAKE 1 TABLET(20 MG) BY MOUTH AT BEDTIME 90 tablet 1   spironolactone (ALDACTONE) 100 MG tablet TAKE 1 TABLET(100 MG) BY MOUTH EVERY DAY 90 tablet 3   triamcinolone (NASACORT AQ) 55 mcg nasal spray as needed     estradioL (ESTRACE) 1 MG tablet TAKE 1 TABLET(1 MG) BY MOUTH EVERY DAY 90 tablet 2   No facility-administered encounter medications on file as of 03/09/2023.     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  Tobacco Use   Smoking status: Never   Smokeless tobacco: Never  Vaping Use   Vaping Use: Never used  Substance and Sexual Activity   Alcohol use: Not Currently   Drug use: Never   Sexual activity: Not Currently    Partners: Male    Birth control/protection: None    FAMILY HISTORY:  Family History  Problem Relation Age of Onset   Heart disease Mother    Diabetes type I Mother    Coronary Artery Disease (Blocked arteries around heart) Mother        Deceased   Diabetes type II Mother        Deceased   Diabetes Mother    Prostate cancer Father        Deceased   High blood pressure (Hypertension) Father        Deceased   Hyperlipidemia  (Elevated cholesterol) Father        Deceased   High blood pressure (Hypertension) Sister    Hyperlipidemia (Elevated cholesterol) Sister    Heart disease Brother    Diabetes type I Brother    Kidney disease Brother    Coronary Artery Disease (Blocked arteries around heart) Brother        Deceased   Diabetes type II Brother        Deceased   Diabetes Brother    No Known Problems Brother    No Known Problems Brother    No Known Problems Brother      GENERAL REVIEW OF SYSTEMS:   General ROS: negative for - chills, fatigue, fever, weight gain or weight loss Allergy and Immunology ROS: negative for - hives  Hematological and Lymphatic ROS: negative for - bleeding problems or bruising, negative for palpable nodes Endocrine ROS: negative for - heat or cold intolerance, hair changes Respiratory ROS: negative for - cough, shortness of breath or wheezing Cardiovascular ROS: no chest pain or palpitations GI ROS: negative for nausea, vomiting, abdominal pain, diarrhea, constipation Musculoskeletal ROS: negative for - joint swelling or muscle pain Neurological ROS: negative for - confusion, syncope Dermatological ROS: negative for pruritus and rash  PHYSICAL EXAM:  Vitals:   03/09/23 0935  BP: 115/81  Pulse: 84  .  Ht:157.5 cm (5' 2.01") Wt:56.7 kg (125 lb) BSA:Body surface area is 1.58 meters squared. Body mass index is 22.86 kg/m..   GENERAL: Alert, active, oriented x3  BREAST:   EXTREMITIES: Well-developed well-nourished symmetrical with no dependent edema.  NEUROLOGICAL: Awake alert oriented, facial expression symmetrical, moving all extremities.      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   No high-grade DCIS was found on final pathology. -Status post excisional biopsy and sentinel node biopsy -Patient completed adjuvant radiation therapy -Patient currently on Arimidex      PLAN:  1.  Right breast excisional biopsy (19120) 2.  Avoid taking aspirin 5 days before the procedure 3.  Contact us if you have any concern  Patient verbalized understanding, all questions were answered, and were agreeable with the plan outlined above.   Haydan Wedig Cintron-Diaz, MD  Electronically signed by Sophiah Rolin Cintron-Diaz, MD  

## 2023-03-23 ENCOUNTER — Encounter
Admission: RE | Admit: 2023-03-23 | Discharge: 2023-03-23 | Disposition: A | Discharge status: Home / Self Care | Payer: BC Managed Care – PPO | Source: Ambulatory Visit | Attending: General Surgery | Admitting: General Surgery

## 2023-03-23 HISTORY — DX: Pure hypercholesterolemia, unspecified: E78.00

## 2023-03-23 HISTORY — DX: Other specified abnormal uterine and vaginal bleeding: N93.8

## 2023-03-23 HISTORY — DX: Unspecified osteoarthritis, unspecified site: M19.90

## 2023-03-23 NOTE — Patient Instructions (Addendum)
Your procedure is scheduled on: Wednesday, April 10 Report to the Registration Desk on the 1st floor of the [facility]. To find out your arrival time, please call [phone] between 1PM - 3PM on: Tuesday, April 9 If your arrival time is 6:00 am, do not arrive before that time as the [place] entrance doors do not open until 6:00 am.  REMEMBER: Instructions that are not followed completely may result in serious medical risk, up to and including death; or upon the discretion of your surgeon and anesthesiologist your surgery may need to be rescheduled.  Do not eat or drink after midnight the night before surgery.  No gum chewing or hard candies.  One week prior to surgery: starting April 3 Stop Anti-inflammatories (NSAIDS) such as Advil, Aleve, Ibuprofen, Motrin, Naproxen, Naprosyn and Aspirin based products such as Excedrin, Goody's Powder, BC Powder. Stop ANY OVER THE COUNTER supplements until after surgery. Stop multiple vitamins You may however, continue to take Tylenol if needed for pain up until the day of surgery.  Continue taking all prescribed medications   TAKE ONLY THESE MEDICATIONS THE MORNING OF SURGERY WITH A SIP OF WATER:  Anastrozole (Arimidex) Omeprazole (Prilosec) - (take one the night before and one on the morning of surgery - helps to prevent nausea after surgery.)  No Alcohol for 24 hours before or after surgery.  No Smoking including e-cigarettes for 24 hours before surgery.  No chewable tobacco products for at least 6 hours before surgery.  No nicotine patches on the day of surgery.  Do not use any "recreational" drugs for at least a week (preferably 2 weeks) before your surgery.  Please be advised that the combination of cocaine and anesthesia may have negative outcomes, up to and including death. If you test positive for cocaine, your surgery will be cancelled.  On the morning of surgery brush your teeth with toothpaste and water, you may rinse your  mouth with mouthwash if you wish. Do not swallow any toothpaste or mouthwash.  Use CHG Soap as directed on instruction sheet.  Do not wear jewelry, make-up, hairpins, clips or nail polish.  Do not wear lotions, powders, or perfumes.   Do not shave body hair from the neck down 48 hours before surgery.  Contact lenses, hearing aids and dentures may not be worn into surgery.  Do not bring valuables to the hospital. [facility] is not responsible for any missing/lost belongings or valuables.   Notify your doctor if there is any change in your medical condition (cold, fever, infection).  Wear comfortable clothing (specific to your surgery type) to the hospital.  After surgery, you can help prevent lung complications by doing breathing exercises.  Take deep breaths and cough every 1-2 hours. Your doctor may order a device called an Incentive Spirometer to help you take deep breaths.  If you are being discharged the day of surgery, you will not be allowed to drive home. You will need a responsible individual to drive you home and stay with you for 24 hours after surgery.   If you are taking public transportation, you will need to have a responsible individual with you.  Please call the [place] [facility] at [phone] if you have any questions about these instructions.  Surgery Visitation Policy:  Patients having surgery or a procedure may have two visitors.  Children under the age of 9 must have an adult with them who is not the patient.     Preparing for Surgery  with CHLORHEXIDINE GLUCONATE (CHG) Soap  Chlorhexidine Gluconate (CHG) Soap  o An antiseptic cleaner that kills germs and bonds with the skin to continue killing germs even after washing  o Used for showering the night before surgery and morning of surgery  Before surgery, you can play an important role by reducing the number of germs on your skin.  CHG (Chlorhexidine gluconate) soap is an antiseptic  cleanser which kills germs and bonds with the skin to continue killing germs even after washing.  Please do not use if you have an allergy to CHG or antibacterial soaps. If your skin becomes reddened/irritated stop using the CHG.  1. Shower the NIGHT BEFORE SURGERY and the MORNING OF SURGERY with CHG soap.  2. If you choose to wash your hair, wash your hair first as usual with your normal shampoo.  3. After shampooing, rinse your hair and body thoroughly to remove the shampoo.  4. Use CHG as you would any other liquid soap. You can apply CHG directly to the skin and wash gently with a scrungie or a clean washcloth.  5. Apply the CHG soap to your body only from the neck down. Do not use on open wounds or open sores. Avoid contact with your eyes, ears, mouth, and genitals (private parts). Wash face and genitals (private parts) with your normal soap.  6. Wash thoroughly, paying special attention to the area where your surgery will be performed.  7. Thoroughly rinse your body with warm water.  8. Do not shower/wash with your normal soap after using and rinsing off the CHG soap.  9. Pat yourself dry with a clean towel.  10. Wear clean pajamas to bed the night before surgery.  12. Place clean sheets on your bed the night of your first shower and do not sleep with pets.  13. Shower again with the CHG soap on the day of surgery prior to arriving at the hospital.  14. Do not apply any deodorants/lotions/powders.  15. Please wear clean clothes to the hospital.

## 2023-03-24 ENCOUNTER — Encounter
Admission: RE | Admit: 2023-03-24 | Discharge: 2023-03-24 | Disposition: A | Discharge status: Home / Self Care | Payer: BC Managed Care – PPO | Source: Ambulatory Visit | Attending: General Surgery | Admitting: General Surgery

## 2023-03-24 DIAGNOSIS — Z01818 Encounter for other preprocedural examination: Secondary | ICD-10-CM | POA: Insufficient documentation

## 2023-03-24 DIAGNOSIS — I10 Essential (primary) hypertension: Secondary | ICD-10-CM | POA: Diagnosis not present

## 2023-03-24 DIAGNOSIS — Z01812 Encounter for preprocedural laboratory examination: Secondary | ICD-10-CM

## 2023-03-24 LAB — CBC
HCT: 36.8 % (ref 36.0–46.0)
Hemoglobin: 11.8 g/dL — ABNORMAL LOW (ref 12.0–15.0)
MCH: 28.3 pg (ref 26.0–34.0)
MCHC: 32.1 g/dL (ref 30.0–36.0)
MCV: 88.2 fL (ref 80.0–100.0)
Platelets: 231 10*3/uL (ref 150–400)
RBC: 4.17 MIL/uL (ref 3.87–5.11)
RDW: 13.4 % (ref 11.5–15.5)
WBC: 5.8 10*3/uL (ref 4.0–10.5)
nRBC: 0 % (ref 0.0–0.2)

## 2023-03-24 LAB — BASIC METABOLIC PANEL
Anion gap: 8 (ref 5–15)
BUN: 20 mg/dL (ref 6–20)
CO2: 23 mmol/L (ref 22–32)
Calcium: 9.4 mg/dL (ref 8.9–10.3)
Chloride: 107 mmol/L (ref 98–111)
Creatinine, Ser: 0.99 mg/dL (ref 0.44–1.00)
GFR, Estimated: >60 mL/min (ref >60)
Glucose, Bld: 103 mg/dL — ABNORMAL HIGH (ref 70–99)
Potassium: 4.2 mmol/L (ref 3.5–5.1)
Sodium: 138 mmol/L (ref 135–145)

## 2023-03-30 MED ORDER — CHLORHEXIDINE GLUCONATE 0.12 % MT SOLN
15.0000 mL | Freq: Once | OROMUCOSAL | Status: AC
  Administered 2023-03-31: 15 mL via OROMUCOSAL

## 2023-03-30 MED ORDER — CEFAZOLIN SODIUM-DEXTROSE 2-4 GM/100ML-% IV SOLN
2.0000 g | INTRAVENOUS | Status: AC
  Administered 2023-03-31: 2 g via INTRAVENOUS

## 2023-03-30 MED ORDER — ORAL CARE MOUTH RINSE: 15.0000 mL | Freq: Once | OROMUCOSAL | Status: AC

## 2023-03-30 MED ORDER — LACTATED RINGERS IV SOLN: INTRAVENOUS | Status: DC

## 2023-03-31 ENCOUNTER — Encounter: Payer: Self-pay | Admitting: General Surgery

## 2023-03-31 ENCOUNTER — Encounter
Admission: RE | Disposition: A | Discharge status: Home / Self Care | Payer: Self-pay | Source: Home / Self Care | Attending: General Surgery

## 2023-03-31 ENCOUNTER — Ambulatory Visit
Admission: RE | Admit: 2023-03-31 | Discharge: 2023-03-31 | Disposition: A | Discharge status: Home / Self Care | Payer: BC Managed Care – PPO | Attending: General Surgery | Admitting: General Surgery

## 2023-03-31 ENCOUNTER — Other Ambulatory Visit: Payer: Self-pay

## 2023-03-31 ENCOUNTER — Ambulatory Visit: Payer: BC Managed Care – PPO | Admitting: Anesthesiology

## 2023-03-31 ENCOUNTER — Ambulatory Visit: Payer: BC Managed Care – PPO

## 2023-03-31 ENCOUNTER — Ambulatory Visit: Payer: BC Managed Care – PPO | Admitting: Urgent Care

## 2023-03-31 DIAGNOSIS — N6452 Nipple discharge: Secondary | ICD-10-CM | POA: Insufficient documentation

## 2023-03-31 DIAGNOSIS — Z01812 Encounter for preprocedural laboratory examination: Secondary | ICD-10-CM

## 2023-03-31 DIAGNOSIS — I10 Essential (primary) hypertension: Secondary | ICD-10-CM

## 2023-03-31 DIAGNOSIS — D241 Benign neoplasm of right breast: Secondary | ICD-10-CM | POA: Diagnosis present

## 2023-03-31 HISTORY — PX: EXCISION OF BREAST BIOPSY: SHX5822

## 2023-03-31 SURGERY — EXCISION OF BREAST BIOPSY
Anesthesia: General | Site: Breast | Laterality: Right

## 2023-03-31 MED ORDER — PHENYLEPHRINE HCL (PRESSORS) 10 MG/ML IV SOLN
INTRAVENOUS | Status: DC | PRN
  Administered 2023-03-31 (×3): 80 ug via INTRAVENOUS

## 2023-03-31 MED ORDER — FENTANYL CITRATE (PF) 100 MCG/2ML IJ SOLN
INTRAMUSCULAR | Status: DC | PRN
  Administered 2023-03-31 (×2): 25 ug via INTRAVENOUS

## 2023-03-31 MED ORDER — PROPOFOL 1000 MG/100ML IV EMUL
INTRAVENOUS | Status: AC
  Filled 2023-03-31: qty 100

## 2023-03-31 MED ORDER — MIDAZOLAM HCL 2 MG/2ML IJ SOLN
INTRAMUSCULAR | Status: AC
  Filled 2023-03-31: qty 2

## 2023-03-31 MED ORDER — CHLORHEXIDINE GLUCONATE 0.12 % MT SOLN
OROMUCOSAL | Status: AC
  Filled 2023-03-31: qty 15

## 2023-03-31 MED ORDER — FENTANYL CITRATE (PF) 100 MCG/2ML IJ SOLN: 25.0000 ug | INTRAMUSCULAR | Status: DC | PRN

## 2023-03-31 MED ORDER — OXYCODONE HCL 5 MG PO TABS: 5.0000 mg | ORAL_TABLET | Freq: Once | ORAL | Status: DC | PRN

## 2023-03-31 MED ORDER — ACETAMINOPHEN 10 MG/ML IV SOLN: 1000.0000 mg | Freq: Once | INTRAVENOUS | Status: DC | PRN

## 2023-03-31 MED ORDER — OXYCODONE HCL 5 MG/5ML PO SOLN: 5.0000 mg | Freq: Once | ORAL | Status: DC | PRN

## 2023-03-31 MED ORDER — PROMETHAZINE HCL 25 MG/ML IJ SOLN: 6.2500 mg | INTRAMUSCULAR | Status: DC | PRN

## 2023-03-31 MED ORDER — KETOROLAC TROMETHAMINE 30 MG/ML IJ SOLN
INTRAMUSCULAR | Status: AC
  Filled 2023-03-31: qty 1

## 2023-03-31 MED ORDER — MIDAZOLAM HCL 2 MG/2ML IJ SOLN
INTRAMUSCULAR | Status: DC | PRN
  Administered 2023-03-31: 2 mg via INTRAVENOUS

## 2023-03-31 MED ORDER — ACETAMINOPHEN 10 MG/ML IV SOLN
INTRAVENOUS | Status: DC | PRN
  Administered 2023-03-31: 1000 mg via INTRAVENOUS

## 2023-03-31 MED ORDER — PHENYLEPHRINE 80 MCG/ML (10ML) SYRINGE FOR IV PUSH (FOR BLOOD PRESSURE SUPPORT)
PREFILLED_SYRINGE | INTRAVENOUS | Status: AC
  Filled 2023-03-31: qty 10

## 2023-03-31 MED ORDER — BUPIVACAINE-EPINEPHRINE (PF) 0.5% -1:200000 IJ SOLN
INTRAMUSCULAR | Status: DC | PRN
  Administered 2023-03-31: 10 mL

## 2023-03-31 MED ORDER — LIDOCAINE HCL (PF) 2 % IJ SOLN
INTRAMUSCULAR | Status: AC
  Filled 2023-03-31: qty 5

## 2023-03-31 MED ORDER — LIDOCAINE HCL (PF) 2 % IJ SOLN
INTRAMUSCULAR | Status: DC | PRN
  Administered 2023-03-31: 100 mg via INTRADERMAL

## 2023-03-31 MED ORDER — PROPOFOL 10 MG/ML IV BOLUS
INTRAVENOUS | Status: AC
  Filled 2023-03-31: qty 20

## 2023-03-31 MED ORDER — ONDANSETRON HCL 4 MG/2ML IJ SOLN
INTRAMUSCULAR | Status: DC | PRN
  Administered 2023-03-31: 4 mg via INTRAVENOUS

## 2023-03-31 MED ORDER — ACETAMINOPHEN 10 MG/ML IV SOLN
INTRAVENOUS | Status: AC
  Filled 2023-03-31: qty 100

## 2023-03-31 MED ORDER — HYDROCODONE-ACETAMINOPHEN 5-325 MG PO TABS: 1.0000 | ORAL_TABLET | ORAL | 0 refills | Status: AC | PRN

## 2023-03-31 MED ORDER — FENTANYL CITRATE (PF) 100 MCG/2ML IJ SOLN
INTRAMUSCULAR | Status: AC
  Filled 2023-03-31: qty 2

## 2023-03-31 MED ORDER — CEFAZOLIN SODIUM-DEXTROSE 2-4 GM/100ML-% IV SOLN
INTRAVENOUS | Status: AC
  Filled 2023-03-31: qty 100

## 2023-03-31 MED ORDER — DEXAMETHASONE SODIUM PHOSPHATE 10 MG/ML IJ SOLN
INTRAMUSCULAR | Status: AC
  Filled 2023-03-31: qty 1

## 2023-03-31 MED ORDER — PROPOFOL 10 MG/ML IV BOLUS
INTRAVENOUS | Status: DC | PRN
  Administered 2023-03-31: 120 mg via INTRAVENOUS

## 2023-03-31 MED ORDER — DEXAMETHASONE SODIUM PHOSPHATE 10 MG/ML IJ SOLN
INTRAMUSCULAR | Status: DC | PRN
  Administered 2023-03-31: 10 mg via INTRAVENOUS

## 2023-03-31 MED ORDER — KETOROLAC TROMETHAMINE 30 MG/ML IJ SOLN
INTRAMUSCULAR | Status: DC | PRN
  Administered 2023-03-31: 30 mg via INTRAVENOUS

## 2023-03-31 MED ORDER — BUPIVACAINE-EPINEPHRINE (PF) 0.5% -1:200000 IJ SOLN
INTRAMUSCULAR | Status: AC
  Filled 2023-03-31: qty 30

## 2023-03-31 MED ORDER — DROPERIDOL 2.5 MG/ML IJ SOLN: 0.6250 mg | Freq: Once | INTRAMUSCULAR | Status: DC | PRN

## 2023-03-31 MED ORDER — ONDANSETRON HCL 4 MG/2ML IJ SOLN
INTRAMUSCULAR | Status: AC
  Filled 2023-03-31: qty 2

## 2023-03-31 MED ORDER — PROPOFOL 500 MG/50ML IV EMUL
INTRAVENOUS | Status: DC | PRN
  Administered 2023-03-31: 125 ug/kg/min via INTRAVENOUS

## 2023-03-31 SURGICAL SUPPLY — 45 items
ADH SKN CLS APL DERMABOND .7 (GAUZE/BANDAGES/DRESSINGS) ×1
APL PRP STRL LF DISP 70% ISPRP (MISCELLANEOUS) ×1
BLADE SURG 15 STRL LF DISP TIS (BLADE) ×1 IMPLANT
BLADE SURG 15 STRL SS (BLADE) ×1
CHLORAPREP W/TINT 26 (MISCELLANEOUS) ×1 IMPLANT
CNTNR URN SCR LID CUP LEK RST (MISCELLANEOUS) ×1 IMPLANT
CONT SPEC 4OZ STRL OR WHT (MISCELLANEOUS) ×1
DERMABOND ADVANCED .7 DNX12 (GAUZE/BANDAGES/DRESSINGS) ×1 IMPLANT
DEVICE DUBIN SPECIMEN MAMMOGRA (MISCELLANEOUS) ×1 IMPLANT
DRAPE LAPAROTOMY TRNSV 106X77 (MISCELLANEOUS) ×1 IMPLANT
ELECT CAUTERY BLADE TIP 2.5 (TIP) ×1
ELECT REM PT RETURN 9FT ADLT (ELECTROSURGICAL) ×1
ELECTRODE CAUTERY BLDE TIP 2.5 (TIP) ×1 IMPLANT
ELECTRODE REM PT RTRN 9FT ADLT (ELECTROSURGICAL) ×1 IMPLANT
GAUZE 4X4 16PLY ~~LOC~~+RFID DBL (SPONGE) ×1 IMPLANT
GLOVE BIO SURGEON STRL SZ 6.5 (GLOVE) ×1 IMPLANT
GLOVE BIOGEL PI IND STRL 6.5 (GLOVE) ×1 IMPLANT
GOWN STRL REUS W/ TWL LRG LVL3 (GOWN DISPOSABLE) ×3 IMPLANT
GOWN STRL REUS W/TWL LRG LVL3 (GOWN DISPOSABLE) ×3
KIT MARKER MARGIN INK (KITS) IMPLANT
KIT TURNOVER KIT A (KITS) ×1 IMPLANT
LABEL OR SOLS (LABEL) ×1 IMPLANT
MANIFOLD NEPTUNE II (INSTRUMENTS) ×1 IMPLANT
MARGIN MAP 10MM (MISCELLANEOUS) ×1 IMPLANT
MARKER MARGIN CORRECT CLIP (MARKER) IMPLANT
NDL HYPO 25X1 1.5 SAFETY (NEEDLE) ×1 IMPLANT
NEEDLE HYPO 25X1 1.5 SAFETY (NEEDLE) ×1 IMPLANT
PACK BASIN MINOR ARMC (MISCELLANEOUS) ×1 IMPLANT
PUNCH BIOPSY DERMAL 6MM STRL (MISCELLANEOUS) IMPLANT
RETRACTOR RING XSMALL (MISCELLANEOUS) IMPLANT
RTRCTR WOUND ALEXIS 13CM XS SH (MISCELLANEOUS)
SUT ETHILON 3-0 FS-10 30 BLK (SUTURE) ×1
SUT MNCRL 4-0 (SUTURE) ×1
SUT MNCRL 4-0 27XMFL (SUTURE) ×1
SUT SILK 2 0 SH (SUTURE) ×1 IMPLANT
SUT VIC AB 3-0 SH 27 (SUTURE) ×1
SUT VIC AB 3-0 SH 27X BRD (SUTURE) ×1 IMPLANT
SUTURE EHLN 3-0 FS-10 30 BLK (SUTURE) ×1 IMPLANT
SUTURE MNCRL 4-0 27XMF (SUTURE) ×1 IMPLANT
SYR 10ML LL (SYRINGE) ×1 IMPLANT
SYR BULB IRRIG 60ML STRL (SYRINGE) ×1 IMPLANT
TRAP FLUID SMOKE EVACUATOR (MISCELLANEOUS) ×1 IMPLANT
TRAP NEPTUNE SPECIMEN COLLECT (MISCELLANEOUS) ×1 IMPLANT
WATER STERILE IRR 1000ML POUR (IV SOLUTION) ×1 IMPLANT
WATER STERILE IRR 500ML POUR (IV SOLUTION) ×1 IMPLANT

## 2023-03-31 NOTE — Transfer of Care (Signed)
Immediate Anesthesia Transfer of Care Note  Patient: Jacqueline Murray  Procedure(s) Performed: EXCISION OF BREAST BIOPSY (Right: Breast)  Patient Location: PACU  Anesthesia Type:General  Level of Consciousness: drowsy  Airway & Oxygen Therapy: Patient Spontanous Breathing and Patient connected to face mask oxygen  Post-op Assessment: Report given to RN and Post -op Vital signs reviewed and stable  Post vital signs: Reviewed and stable  Last Vitals:  Vitals Value Taken Time  BP 107/78 03/31/23 0818  Temp 97.2   Pulse 89 03/31/23 0821  Resp 14 03/31/23 0821  SpO2 98 % 03/31/23 0821  Vitals shown include unvalidated device data.  Last Pain:  Vitals:   03/31/23 0654  TempSrc: Temporal  PainSc: 0-No pain         Complications: No notable events documented.

## 2023-03-31 NOTE — Op Note (Signed)
Preoperative diagnosis: Right breast nipple discharge.  Postoperative diagnosis: Right breast nipple discharge.   Procedure: Right breast excisional biopsy.                      Anesthesia: GETA  Surgeon: Dr. Hazle Quant  Wound Classification: Clean  Indications: Patient is a 57 y.o. female with a nonpalpable right nipple mass noted on breast ultrasound that was not able to be biopsied with needle approach. Requires surgical excisional biopsy to rule out atypia or malignancy.   Findings: 1. Nipple discharge (clear) from central duct of the right nipple  Description of procedure: The patient was taken to the operating room and placed supine on the operating table, and after general anesthesia the right chest was prepped and draped in the usual sterile fashion. A time-out was completed verifying correct patient, procedure, site, positioning, and implant(s) and/or special equipment prior to beginning this procedure.  A circumareolar skin incision was planned in such a way as to minimize the amount of dissection to reach the mass around the nipple.  The skin incision was made.  Dissection was then taken down circumferentially, taking care to include the small mass inside the nipple. The specimen was removed. The wound was irrigated. Hemostasis was checked. The wound was closed with interrupted sutures of 4-0 Monocryl and a subcuticular suture of Monocryl 4-0. No attempt was made to close the dead space.   Specimen: Right nipple mass                     Complications: None  Estimated Blood Loss: 5 mL

## 2023-03-31 NOTE — Interval H&P Note (Signed)
History and Physical Interval Note:  03/31/2023 7:10 AM  Jacqueline Murray  has presented today for surgery, with the diagnosis of intraductal papilloma of right breast D24.1.  The various methods of treatment have been discussed with the patient and family. After consideration of risks, benefits and other options for treatment, the patient has consented to  Procedure(s): EXCISION OF BREAST BIOPSY (Right) as a surgical intervention.  The patient's history has been reviewed, patient examined, no change in status, stable for surgery.  I have reviewed the patient's chart and labs.  Questions were answered to the patient's satisfaction.     Carolan Shiver

## 2023-03-31 NOTE — Discharge Instructions (Addendum)
  Diet: Resume home heart healthy regular diet.   Activity: Increase activity as tolerated.   Wound care: May shower with soapy water and pat dry (do not rub incisions), but no baths or submerging incision underwater until follow-up. (no swimming)   Medications: Resume all home medications. For mild to moderate pain: acetaminophen (Tylenol) or ibuprofen (if no kidney disease). Combining Tylenol with alcohol can substantially increase your risk of causing liver disease. Narcotic pain medications, if prescribed, can be used for severe pain, though may cause nausea, constipation, and drowsiness. Do not combine Tylenol and Norco within a 6 hour period as Norco contains Tylenol. If you do not need the narcotic pain medication, you do not need to fill the prescription.  Call office ([phone]) at any time if any questions, worsening pain, fevers/chills, bleeding, drainage from incision site, or other concerns.  AMBULATORY SURGERY  DISCHARGE INSTRUCTIONS   The drugs that you were given will stay in your system until tomorrow so for the next 24 hours you should not:  Drive an automobile Make any legal decisions Drink any alcoholic beverage   You may resume regular meals tomorrow.  Today it is better to start with liquids and gradually work up to solid foods.  You may eat anything you prefer, but it is better to start with liquids, then soup and crackers, and gradually work up to solid foods.   Please notify your doctor immediately if you have any unusual bleeding, trouble breathing, redness and pain at the surgery site, drainage, fever, or pain not relieved by medication.    Additional Instructions:    Please contact your physician with any problems or Same Day Surgery at [phone], Monday through Friday 6 am to 4 pm, or [place] [place] [facility] [place] [facility] number at [phone].   

## 2023-03-31 NOTE — Anesthesia Procedure Notes (Addendum)
Procedure Name: LMA Insertion Date/Time: 03/31/2023 7:39 AM  Performed by: Ellen Mayol, [place], CRNAPre-anesthesia Checklist: Patient identified, Patient being monitored, Timeout performed, Emergency Drugs available and Suction available Patient Re-evaluated:Patient Re-evaluated prior to induction Oxygen Delivery Method: Circle system utilized Preoxygenation: Pre-oxygenation with 100% oxygen Induction Type: IV induction Ventilation: Mask ventilation without difficulty LMA: LMA inserted LMA Size: 4.0 Tube type: Oral Number of attempts: 1 Placement Confirmation: positive ETCO2 and breath sounds checked- equal and bilateral Tube secured with: Tape Dental Injury: Teeth and Oropharynx as per pre-operative assessment

## 2023-03-31 NOTE — Anesthesia Preprocedure Evaluation (Signed)
Anesthesia Evaluation  Patient identified by MRN, date of birth, ID band Patient awake    Reviewed: Allergy & Precautions, H&P , NPO status , Patient's Chart, lab work & pertinent test results, reviewed documented beta blocker date and time   History of Anesthesia Complications (+) PONV and history of anesthetic complications  Airway Mallampati: II   Neck ROM: full    Dental  (+) Poor Dentition   Pulmonary neg pulmonary ROS   Pulmonary exam normal        Cardiovascular Exercise Tolerance: Good hypertension, On Medications negative cardio ROS Normal cardiovascular exam Rhythm:regular Rate:Normal     Neuro/Psych  Headaches  negative psych ROS   GI/Hepatic Neg liver ROS,GERD  Medicated,,  Endo/Other  negative endocrine ROS    Renal/GU negative Renal ROS  negative genitourinary   Musculoskeletal   Abdominal   Peds  Hematology  (+) Blood dyscrasia, anemia   Anesthesia Other Findings Past Medical History: No date: Anemia No date: Arthritis 04/19/2020: Colon polyp No date: Complication of anesthesia No date: Ductal carcinoma in situ (DCIS) of right breast No date: Dysfunctional uterine bleeding No date: GERD (gastroesophageal reflux disease) No date: Headache No date: HLD (hyperlipidemia) No date: Hypertension No date: IBS (irritable bowel syndrome) 01/21/2012: Intraductal papilloma of breast     Comment:  left breast No date: Intraductal papilloma of breast, right No date: PONV (postoperative nausea and vomiting)     Comment:  EXTREME NAUSEA No date: Pure hypercholesterolemia Past Surgical History: 2016: ABDOMINAL HYSTERECTOMY few yrs ago: BREAST BIOPSY; Bilateral     Comment:  benign-cyst 1986: BREAST BIOPSY; Right     Comment:  fibroadenoma 05/17/2020: BREAST BIOPSY; Right     Comment:  neg/ MRI bx 2 areas 03/09/2022: BREAST BIOPSY; Right     Comment:  rt breast stereo calcs x clip path  pending 03/03/2023: BREAST BIOPSY; Right     Comment:  [place] RT BREAST BX W LOC DEV 1ST LESION IMG BX SPEC [place]               GUIDE 03/03/2023 [facility] 06/05/2020: BREAST BIOPSY WITH RADIO FREQUENCY LOCALIZER; Right     Comment:  Procedure: BREAST BIOPSY WITH RADIO FREQUENCY LOCALIZER;              Surgeon: Carolan Shiver, MD;  Location: [facility];               Service: General;  Laterality: Right; 03/09/2022: breast biposy; Right     Comment:  u/s core bc heart clip path pending 05/26/2020: BREAST EXCISIONAL BIOPSY; Right     Comment:  neg 2013: BREAST EXCISIONAL BIOPSY; Left     Comment:  neg 06/17/1994: CESAREAN SECTION 2011: COLONOSCOPY 09/25/2020: COLONOSCOPY 05/06/2022: PARTIAL MASTECTOMY WITH AXILLARY SENTINEL LYMPH NODE  BIOPSY; Right     Comment:  Procedure: PARTIAL MASTECTOMY WITH AXILLARY SENTINEL               LYMPH NODE BIOPSY With RF tag;  Surgeon: Carolan Shiver, MD;  Location: [facility];  Service: General;                Laterality: Right; BMI    Body Mass Index: 22.31 kg/m     Reproductive/Obstetrics negative OB ROS                             Anesthesia Physical Anesthesia  Plan  ASA: 2  Anesthesia Plan: General   Post-op Pain Management:    Induction:   PONV Risk Score and Plan: TIVA, Ondansetron, Dexamethasone and Propofol infusion  Airway Management Planned:   Additional Equipment:   Intra-op Plan:   Post-operative Plan:   Informed Consent: I have reviewed the patients History and Physical, chart, labs and discussed the procedure including the risks, benefits and alternatives for the proposed anesthesia with the patient or authorized representative who has indicated his/her understanding and acceptance.     Dental Advisory Given  Plan Discussed with: CRNA  Anesthesia Plan Comments:        Anesthesia Quick Evaluation

## 2023-03-31 NOTE — Anesthesia Postprocedure Evaluation (Signed)
Anesthesia Post Note  Patient: Jacqueline Murray  Procedure(s) Performed: EXCISION OF BREAST BIOPSY (Right: Breast)  Patient location during evaluation: PACU Anesthesia Type: General Level of consciousness: awake and alert Pain management: pain level controlled Vital Signs Assessment: post-procedure vital signs reviewed and stable Respiratory status: spontaneous breathing, nonlabored ventilation, respiratory function stable and patient connected to nasal cannula oxygen Cardiovascular status: blood pressure returned to baseline and stable Postop Assessment: no apparent nausea or vomiting Anesthetic complications: no   No notable events documented.   Last Vitals:  Vitals:   03/31/23 0847 03/31/23 0857  BP: (!) 133/96 (!) 142/95  Pulse: 65 67  Resp: 20 16  Temp: 36.6 C 36.5 C  SpO2: 100% 100%    Last Pain:  Vitals:   03/31/23 0857  TempSrc: Temporal  PainSc: 0-No pain                 Yevette Edwards

## 2023-04-01 ENCOUNTER — Encounter: Payer: Self-pay | Admitting: General Surgery

## 2023-04-02 LAB — SURGICAL PATHOLOGY

## 2023-04-14 ENCOUNTER — Inpatient Hospital Stay: Payer: BC Managed Care – PPO | Attending: Oncology

## 2023-04-14 ENCOUNTER — Inpatient Hospital Stay (HOSPITAL_BASED_OUTPATIENT_CLINIC_OR_DEPARTMENT_OTHER): Payer: BC Managed Care – PPO | Admitting: Oncology

## 2023-04-14 ENCOUNTER — Other Ambulatory Visit: Payer: Self-pay | Admitting: *Deleted

## 2023-04-14 ENCOUNTER — Other Ambulatory Visit: Payer: BC Managed Care – PPO

## 2023-04-14 ENCOUNTER — Encounter: Payer: Self-pay | Admitting: Oncology

## 2023-04-14 ENCOUNTER — Other Ambulatory Visit: Payer: Self-pay | Admitting: General Surgery

## 2023-04-14 ENCOUNTER — Ambulatory Visit: Payer: BC Managed Care – PPO | Admitting: Oncology

## 2023-04-14 VITALS — BP 110/88 | HR 84 | Temp 97.7°F | Resp 18 | Wt 134.0 lb

## 2023-04-14 DIAGNOSIS — M8580 Other specified disorders of bone density and structure, unspecified site: Secondary | ICD-10-CM | POA: Insufficient documentation

## 2023-04-14 DIAGNOSIS — Z79811 Long term (current) use of aromatase inhibitors: Secondary | ICD-10-CM | POA: Diagnosis not present

## 2023-04-14 DIAGNOSIS — D0511 Intraductal carcinoma in situ of right breast: Secondary | ICD-10-CM

## 2023-04-14 DIAGNOSIS — Z170 Estrogen receptor positive status [ER+]: Secondary | ICD-10-CM | POA: Insufficient documentation

## 2023-04-14 LAB — COMPREHENSIVE METABOLIC PANEL
ALT: 30 U/L (ref 0–44)
AST: 27 U/L (ref 15–41)
Albumin: 4.4 g/dL (ref 3.5–5.0)
Alkaline Phosphatase: 66 U/L (ref 38–126)
Anion gap: 9 (ref 5–15)
BUN: 17 mg/dL (ref 6–20)
CO2: 24 mmol/L (ref 22–32)
Calcium: 9.6 mg/dL (ref 8.9–10.3)
Chloride: 103 mmol/L (ref 98–111)
Creatinine, Ser: 0.99 mg/dL (ref 0.44–1.00)
GFR, Estimated: >60 mL/min (ref >60)
Glucose, Bld: 133 mg/dL — ABNORMAL HIGH (ref 70–99)
Potassium: 4.5 mmol/L (ref 3.5–5.1)
Sodium: 136 mmol/L (ref 135–145)
Total Bilirubin: 1.1 mg/dL (ref 0.3–1.2)
Total Protein: 8.1 g/dL (ref 6.5–8.1)

## 2023-04-14 LAB — CBC WITH DIFFERENTIAL/PLATELET
Abs Immature Granulocytes: 0.01 10*3/uL (ref 0.00–0.07)
Basophils Absolute: 0 10*3/uL (ref 0.0–0.1)
Basophils Relative: 1 %
Eosinophils Absolute: 0.2 10*3/uL (ref 0.0–0.5)
Eosinophils Relative: 2 %
HCT: 39.5 % (ref 36.0–46.0)
Hemoglobin: 12.6 g/dL (ref 12.0–15.0)
Immature Granulocytes: 0 %
Lymphocytes Relative: 21 %
Lymphs Abs: 1.4 10*3/uL (ref 0.7–4.0)
MCH: 28.3 pg (ref 26.0–34.0)
MCHC: 31.9 g/dL (ref 30.0–36.0)
MCV: 88.6 fL (ref 80.0–100.0)
Monocytes Absolute: 0.5 10*3/uL (ref 0.1–1.0)
Monocytes Relative: 8 %
Neutro Abs: 4.5 10*3/uL (ref 1.7–7.7)
Neutrophils Relative %: 68 %
Platelets: 226 10*3/uL (ref 150–400)
RBC: 4.46 MIL/uL (ref 3.87–5.11)
RDW: 13.2 % (ref 11.5–15.5)
WBC: 6.6 10*3/uL (ref 4.0–10.5)
nRBC: 0 % (ref 0.0–0.2)

## 2023-04-14 MED ORDER — ANASTROZOLE 1 MG PO TABS: 1.0000 mg | ORAL_TABLET | Freq: Every day | ORAL | 1 refills | Status: DC

## 2023-04-14 NOTE — Assessment & Plan Note (Signed)
Continue calcium and vitamin D supplementation.  History of osteopenia ][2019], 08/07/22 DEXA normal bone density 

## 2023-04-14 NOTE — Progress Notes (Signed)
Pt here for follow up. Pt reports that she had excisional biopsy 2 weeks ago.

## 2023-04-14 NOTE — Assessment & Plan Note (Addendum)
#  Right breast DCIS, ER positive. 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 Continue Arimidex  daily. Rx sent March 2024 Mammogram showed 5 mm intraductal mass in a focally dilated duct in the posterior aspect of the right nipple,s/p excisional biopsy 03/31/23  - atypical intraepithelial cells worrisome for pagetoid extension of underlying DCIS into lactiferous ducts of the nipple. Discussed with Dr. Maia Plan, he has discussed the case with pathology. The small mass was a benign intraductal papilloma, atypical cells was an incidental finding . Will discuss case on tumor board.  Repeat MRI in a few months - Dr. Maia Plan will order.

## 2023-04-14 NOTE — Progress Notes (Signed)
Hematology/Oncology Progress note [phone] [phone]           CHIEF COMPLAINTS/REASON FOR VISIT:   Right breast DCIS   ASSESSMENT & PLAN:   Cancer Staging  Ductal carcinoma in situ (DCIS) of right breast Staging form: Breast, AJCC 8th Edition - Pathologic stage from 05/21/2022: Stage 0 (pTis (DCIS), pN0, cM0, G1, ER+, PR: Not Assessed, HER2: Not Assessed) - Signed by Rickard Patience, MD on 05/21/2022   Ductal carcinoma in situ (DCIS) of right breast #Right breast DCIS, ER positive. 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 Continue Arimidex  daily. Rx sent March 2024 Mammogram showed 5 mm intraductal mass in a focally dilated duct in the posterior aspect of the right nipple,s/p excisional biopsy 03/31/23  - atypical intraepithelial cells worrisome for pagetoid extension of underlying DCIS into lactiferous ducts of the nipple. Discussed with Dr. Maia Plan. Will discuss case on tumor board.  Repeat MRI in a few months - Dr. Maia Plan will order.    Aromatase inhibitor use Continue calcium and vitamin D supplementation.  History of osteopenia[2019], 08/07/22 DEXA normal bone density   Orders Placed This Encounter  Procedures   CBC with Differential (Cancer Center Only)    Standing Status:   Future    Standing Expiration Date:   04/13/2024   CMP (Cancer Center only)    Standing Status:   Future    Standing Expiration Date:   04/13/2024   Follow up in  6 months.  All questions were answered. The patient knows to call the clinic with any problems, questions or concerns.  Rickard Patience, MD, PhD [facility] Hematology Oncology 04/14/2023   Mick Sell, MD    HISTORY OF PRESENTING ILLNESS:   Jacqueline Murray is a  57 y.o.  female presents for follow up of DCIS right breast  Patient has a history of papilloma in 2021. 04/29/2020, bilateral breast MRI  showed suspicious clumped non-mass enhancement at 12:00 in the right breast, and an indeterminate suspicious mass at the 9:00 in the right breast. 05/17/2020, 9:00 breast mass positive for papilloma with usual ductal hyperplasia.  12:00 mass was benign. 06/05/2020, right breast excisional biopsy showed benign breast tissue with intraductal papilloma, incidental fibroadenoma, fibrocystic changes, usual ductal hyperplasia, prior biopsy changes.  Negative for atypia and malignancy.  Right breast ductal excisional biopsy was negative for atypia and malignancy.  Patient complains of history of bloody right nipple discharge.  History of papilloma excised in 2021.  She also has a left breast mass was originally seen on 02/12/2020 study and being watched. 02/25/2022, bilateral breast mammogram showed Suspicious calcifications in the upper outer quadrant of the right breast; intra ductal mass in the 3:00 region of the right breast 3 cm from nipple.;  Stable benign-appearing mass in the 3: 30 region of the left breast.  Sonographic evaluation of the right axilla does not show any enlarged adenopathy.  03/09/2022, patient underwent right breast 3:00 3 cm from the nipple, ultrasound-guided biopsy, pathology showed fragment of intraductal papilloma, with associated usual ductal hyperplasia.  Background mammary parenchyma with fibrocystic changes negative for atypical proliferative breast disease.;  Right upper outer quadrant calcification stereotactic core needle biopsy showed DCIS, high-grade with comedonecrosis.  Calcifications associated with DCIS.  Negative for invasive mammary carcinoma.  Patient has been seen by Dr. Maia Plan.  Recommended bilateral MRI for further evaluation of the extent of DCIS 03/20/2022, bilateral breast  MRI 1. Large area of non mass enhancement within the superior central right breast adjacent to the marking clip, potentially representing additional DCIS adjacent to the recent site of biopsy. 2.  Multiple enhancing masses are demonstrated throughout the right and left breast. These are indeterminate in etiology however may represent a background of papillomatosis. Additional etiologies not excluded. 3. Cortically thickened right axillary lymph node, indeterminate.  Patient was referred to establish care with oncology for further evaluation management.  Patient has no new complaints today.  Some soreness at the site of previous biopsy. Family history is positive for father with prostate cancer.  No family history of breast cancer.  Menarche 57 years of age Age at first birth 54, she has 1 daughter. Patient has taken OCP 20+ years. History of hysterectomy. + Hormone replacement therapy since 2016 /2017, recently stopped Previous right breast excisional biopsy-papilloma  03/20/2022, bilateral breast MRI showed additional non-mass enhancement adjacent to the biopsy area, as well as multiple enhancing masses throughout the right and left breast.  Cortically thickened right axillary lymph node. Patient will get additional biopsy-MRI biopsy of the non-mass enhancement within the superior central right breast, dominant mass within the lower outer right breast, and dominant mass in the left breast.  Ultrasound for second look at the axillary lymph node.  04/09/22, additional fine-needle biopsy right breast superior central core needle biopsy showed usual ductal hyperplasia, columnar cells and fibrocystic changes.  Fat necrosis and foreign body giant cell reaction.  No malignancy identified. Right breast lower outer needle biopsy showed usual ductal hyperplasia and fibrocystic changes.  No malignancy identified. Left breast lower outer needle biopsy showed usual ductal hyperplasia and columnar cell and fibrocystic changes.  No malignancy identified. All were found to be concordant by Dr. Annia Belt.  04/30/2022, second look right axilla showed no suspicious right axillary lymphadenopathy.  Biopsy  was canceled  05/06/2022, patient underwent lumpectomy. Right breast partial mastectomy showed a single foci of low-grade DCIS in situ, 2.5 mm, completely excised.  Single separated focus of atypical ductal hyperplasia.  Background benign mammary parenchyma with intraductal papilloma, focally sclerosing, fibrocystic changes, and florid usual ductal hyperplasia.  Negative for malignancy and residual high-grade ductal carcinoma in situ.  Sentinel lymph node biopsy showed 4 lymph nodes all negative for malignancy.  Additional right breast excision showed benign mammary parenchyma with intraductal papilloma, associated florid usual ductal hyperplasia.  Negative for atypical perforated breast disease. pTis pN0, estrogen receptor 90% positive.  #Family history of cancer, Patient has establish care with genetic counselor and had [employer] done.  No pathological mutations.she has PALB2 VUS.   06/04/2022 - 06/26/2022 status post adjuvant radiation 07/13/22 started on Arimidex  08/07/22 DEXA normal bone density INTERVAL HISTORY Jacqueline Murray is a 57 y.o. female who has above history reviewed by me today presents for follow up visit for management of right breast DCIS She has started on Arimidex since last visit. Manageable side effects.  03/01/2023 Mammogram showed 5 mm intraductal mass in a focally dilated duct in the posterior aspect of the right nipple,s/p excisional biopsy 03/31/23  - atypical intraepithelial cells worrisome for pagetoid extension of underlying DCIS into lactiferous ducts of the nipple.  Review of Systems  Constitutional:  Negative for appetite change, chills, fatigue and fever.  HENT:   Negative for hearing loss and voice change.   Eyes:  Negative for eye problems.  Respiratory:  Negative for chest tightness and cough.   Cardiovascular:  Negative for chest pain.  Gastrointestinal:  Negative for abdominal distention, abdominal pain and blood in stool.  Endocrine: Negative for hot  flashes.  Genitourinary:  Negative for difficulty urinating and frequency.   Musculoskeletal:  Negative for arthralgias.  Skin:  Negative for itching and rash.  Neurological:  Negative for extremity weakness.  Hematological:  Negative for adenopathy.  Psychiatric/Behavioral:  Negative for confusion.     MEDICAL HISTORY:  Past Medical History:  Diagnosis Date   Anemia    Arthritis    Colon polyp 04/19/2020   Complication of anesthesia    Ductal carcinoma in situ (DCIS) of right breast    Dysfunctional uterine bleeding    GERD (gastroesophageal reflux disease)    Headache    HLD (hyperlipidemia)    Hypertension    IBS (irritable bowel syndrome)    Intraductal papilloma of breast 01/21/2012   left breast   Intraductal papilloma of breast, right    PONV (postoperative nausea and vomiting)    EXTREME NAUSEA   Pure hypercholesterolemia     SURGICAL HISTORY: Past Surgical History:  Procedure Laterality Date   ABDOMINAL HYSTERECTOMY  2016   BREAST BIOPSY Bilateral few yrs ago   benign-cyst   BREAST BIOPSY Right 1986   fibroadenoma   BREAST BIOPSY Right 05/17/2020   neg/ MRI bx 2 areas   BREAST BIOPSY Right 03/09/2022   rt breast stereo calcs x clip path pending   BREAST BIOPSY Right 03/03/2023   [place] RT BREAST BX W LOC DEV 1ST LESION IMG BX SPEC US GUIDE 03/03/2023 [facility]   BREAST BIOPSY WITH RADIO FREQUENCY LOCALIZER Right 06/05/2020   Procedure: BREAST BIOPSY WITH RADIO FREQUENCY LOCALIZER;  Surgeon: Carolan Shiver, MD;  Location: [facility];  Service: General;  Laterality: Right;   breast biposy Right 03/09/2022   u/s core bc heart clip path pending   BREAST EXCISIONAL BIOPSY Right 05/26/2020   neg   BREAST EXCISIONAL BIOPSY Left 2013   neg   CESAREAN SECTION  06/17/1994   COLONOSCOPY  2011   COLONOSCOPY  09/25/2020   EXCISION OF BREAST BIOPSY Right 03/31/2023   Procedure: EXCISION OF BREAST BIOPSY;  Surgeon: Carolan Shiver, MD;  Location: [facility];  Service: General;  Laterality: Right;   PARTIAL MASTECTOMY WITH AXILLARY SENTINEL LYMPH NODE BIOPSY Right 05/06/2022   Procedure: PARTIAL MASTECTOMY WITH AXILLARY SENTINEL LYMPH NODE BIOPSY With RF tag;  Surgeon: Carolan Shiver, MD;  Location: [facility];  Service: General;  Laterality: Right;    SOCIAL HISTORY: Social History   Socioeconomic History   Marital status: Divorced    Spouse name: Not on file   Number of children: 1   Years of education: Not on file   Highest education level: Not on file  Occupational History   Not on file  Tobacco Use   Smoking status: Never    Passive exposure: Never   Smokeless tobacco: Never  Vaping Use   Vaping Use: Never used  Substance and Sexual Activity   Alcohol use: Not Currently   Drug use: Never   Sexual activity: Not Currently  Other Topics Concern   Not on file  Social History Narrative   Lives alone   Social Determinants of Health   Financial Resource Strain: Not on file  Food Insecurity: Not on file  Transportation Needs: Not on file  Physical Activity: Not on file  Stress: Not on file  Social Connections: Not on file  Intimate Partner Violence: Not on file    FAMILY HISTORY: Family History  Problem Relation Age of Onset   Diabetes Mellitus II Mother    Heart disease Mother    Prostate cancer Father 64       metastatic   Hypertension Father    CAD Father    Hypertension Sister    Hyperlipidemia Sister    CAD Brother    Diabetes Mellitus II Brother    Heart disease Brother    Kidney disease Brother    Breast cancer Neg Hx     ALLERGIES:  is allergic to ace inhibitors, latex, povidone iodine, shellfish-derived products, and tape.  MEDICATIONS:  Current Outpatient Medications  Medication Sig Dispense Refill   Calcium Carbonate (CALTRATE 600 PO) Take 1,200 mg by mouth daily.     ibuprofen (ADVIL) 200 MG tablet Take 400 mg by mouth every 6 (six) hours as needed.     loratadine (CLARITIN) 10 MG  tablet Take 10 mg by mouth at bedtime.     montelukast (SINGULAIR) 10 MG tablet Take 10 mg by mouth at bedtime.     Multiple Vitamins-Minerals (MULTIVITAMIN ADULT, MINERALS,) TABS Take 1 tablet by mouth at bedtime.     omeprazole (PRILOSEC) 20 MG capsule Take 20 mg by mouth daily as needed (Heartburn).     simvastatin (ZOCOR) 20 MG tablet Take 20 mg by mouth at bedtime.     spironolactone (ALDACTONE) 100 MG tablet Take 100 mg by mouth daily.     triamcinolone (NASACORT) 55 MCG/ACT AERO nasal inhaler Place 2 sprays into the nose daily.      anastrozole (ARIMIDEX) 1 MG tablet Take 1 tablet (1 mg total) by mouth daily. 90 tablet 1   No current facility-administered medications for this visit.     PHYSICAL EXAMINATION: ECOG PERFORMANCE STATUS: 0 - Asymptomatic Vitals:   04/14/23 1008  BP: 110/88  Pulse: 84  Resp: 18  Temp: 97.7 F (36.5 C)   Filed Weights   04/14/23 1008  Weight: 134 lb (60.8 kg)    Physical Exam Constitutional:      General: She is not in acute distress. HENT:     Head: Normocephalic and atraumatic.  Eyes:     General: No scleral icterus. Cardiovascular:     Rate and Rhythm: Normal rate and regular rhythm.     Heart sounds: Normal heart sounds.  Pulmonary:     Effort: Pulmonary effort is normal. No respiratory distress.     Breath sounds: No wheezing.  Abdominal:     General: Bowel sounds are normal. There is no distension.     Palpations: Abdomen is soft.  Musculoskeletal:        General: No deformity. Normal range of motion.     Cervical back: Normal range of motion and neck supple.  Skin:    General: Skin is warm and dry.     Findings: No erythema or rash.  Neurological:     Mental Status: She is alert and oriented to person, place, and time. Mental status is at baseline.     Cranial Nerves: No cranial nerve deficit.     Coordination: Coordination normal.  Psychiatric:        Mood and Affect: Mood normal.      LABORATORY DATA:  I have  reviewed the data as listed    Latest Ref Rng & Units 04/14/2023    9:55 AM 03/24/2023    9:11 AM 10/13/2022    2:06 PM  CBC  WBC 4.0 - 10.5 K/uL 6.6  5.8  6.9  Hemoglobin 12.0 - 15.0 g/dL 11.9  14.7  82.9   Hematocrit 36.0 - 46.0 % 39.5  36.8  38.5   Platelets 150 - 400 K/uL 226  231  259       Latest Ref Rng & Units 04/14/2023    9:55 AM 03/24/2023    9:11 AM 10/13/2022    2:06 PM  CMP  Glucose 70 - 99 mg/dL 562  130  865   BUN 6 - 20 mg/dL 17  20  12    Creatinine 0.44 - 1.00 mg/dL 7.84  6.96  2.95   Sodium 135 - 145 mmol/L 136  138  138   Potassium 3.5 - 5.1 mmol/L 4.5  4.2  3.9   Chloride 98 - 111 mmol/L 103  107  105   CO2 22 - 32 mmol/L 24  23  27    Calcium 8.9 - 10.3 mg/dL 9.6  9.4  9.4   Total Protein 6.5 - 8.1 g/dL 8.1   7.7   Total Bilirubin 0.3 - 1.2 mg/dL 1.1   0.7   Alkaline Phos 38 - 126 U/L 66   56   AST 15 - 41 U/L 27   26   ALT 0 - 44 U/L 30   26      RADIOGRAPHIC STUDIES: I have personally reviewed the radiological images as listed and agreed with the findings in the report. No results found.

## 2023-04-19 ENCOUNTER — Encounter: Payer: Self-pay | Admitting: Oncology

## 2023-05-03 ENCOUNTER — Other Ambulatory Visit: Payer: Self-pay | Admitting: Oncology

## 2023-05-03 ENCOUNTER — Telehealth: Payer: Self-pay | Admitting: Oncology

## 2023-05-03 NOTE — Telephone Encounter (Signed)
Tumor board discussion recommendation discussed with patient over the phone.  Atypical cells were in the specimen, confirmed with Dr. Oneita Kras that this finding does not meet diagnostic criteria of DCIS yet. Attention on follow up.  Consensus reached upon continue image surveillance. Dr.Cintron will order MRI breast in a few months. Patient appreciates the update.

## 2023-05-12 ENCOUNTER — Ambulatory Visit
Admission: RE | Admit: 2023-05-12 | Discharge: 2023-05-12 | Disposition: A | Discharge status: Home / Self Care | Payer: BC Managed Care – PPO | Source: Ambulatory Visit | Attending: General Surgery | Admitting: General Surgery

## 2023-05-12 DIAGNOSIS — D0511 Intraductal carcinoma in situ of right breast: Secondary | ICD-10-CM | POA: Insufficient documentation

## 2023-05-12 MED ORDER — GADOBUTROL 1 MMOL/ML IV SOLN
6.0000 mL | Freq: Once | INTRAVENOUS | Status: AC | PRN
  Administered 2023-05-12: 6 mL via INTRAVENOUS

## 2023-05-31 ENCOUNTER — Encounter: Payer: Self-pay | Admitting: Licensed Clinical Social Worker

## 2023-05-31 NOTE — Progress Notes (Signed)
UPDATE: PALB2 p.[id number] VUS has been amended to Likely Benign. Amended report date is 05/24/2023.

## 2023-06-16 ENCOUNTER — Ambulatory Visit: Payer: Self-pay | Admitting: General Surgery

## 2023-06-17 ENCOUNTER — Ambulatory Visit: Payer: Self-pay | Admitting: General Surgery

## 2023-06-17 ENCOUNTER — Other Ambulatory Visit: Payer: Self-pay

## 2023-06-17 ENCOUNTER — Encounter
Admission: RE | Admit: 2023-06-17 | Discharge: 2023-06-17 | Disposition: A | Discharge status: Home / Self Care | Payer: BC Managed Care – PPO | Source: Ambulatory Visit | Attending: General Surgery | Admitting: General Surgery

## 2023-06-17 NOTE — Patient Instructions (Addendum)
Your procedure is scheduled on: 06/25/23 - Friday Report to the Registration Desk on the 1st floor of the [facility]. To find out your arrival time, please call [phone] between 1PM - 3PM on: 06/23/23 - Wednesday If your arrival time is 6:00 am, do not arrive before that time as the [facility] entrance doors do not open until 6:00 am.  REMEMBER: Instructions that are not followed completely may result in serious medical risk, up to and including death; or upon the discretion of your surgeon and anesthesiologist your surgery may need to be rescheduled.  Do not eat food or drink any liquids after midnight the night before surgery.  No gum chewing or hard candies.   One week prior to surgery: Stop Anti-inflammatories (NSAIDS) such as Advil, Aleve, Ibuprofen, Motrin, Naproxen, Naprosyn and Aspirin based products such as Excedrin, Goody's Powder, BC Powder.  Stop ANY OVER THE COUNTER supplements until after surgery.  You may take Tylenol if needed for pain up until the day of surgery.   TAKE ONLY THESE MEDICATIONS THE MORNING OF SURGERY WITH A SIP OF WATER:  omeprazole (PRILOSEC) - (take one the night before and one on the morning of surgery - helps to prevent nausea after surgery.) anastrozole (ARIMIDEX)    No Alcohol for 24 hours before or after surgery.  No Smoking including e-cigarettes for 24 hours before surgery.  No chewable tobacco products for at least 6 hours before surgery.  No nicotine patches on the day of surgery.  Do not use any "recreational" drugs for at least a week (preferably 2 weeks) before your surgery.  Please be advised that the combination of cocaine and anesthesia may have negative outcomes, up to and including death. If you test positive for cocaine, your surgery will be cancelled.  On the morning of surgery brush your teeth with toothpaste and water, you may rinse your mouth with mouthwash if you wish. Do not swallow any toothpaste or  mouthwash.  Use CHG Soap or wipes as directed on instruction sheet.  Do not wear jewelry, make-up, hairpins, clips or nail polish.  Do not wear lotions, powders, or perfumes.   Do not shave body hair from the neck down 48 hours before surgery.  Contact lenses, hearing aids and dentures may not be worn into surgery.  Do not bring valuables to the hospital. [facility] is not responsible for any missing/lost belongings or valuables.   Notify your doctor if there is any change in your medical condition (cold, fever, infection).  Wear comfortable clothing (specific to your surgery type) to the hospital.  After surgery, you can help prevent lung complications by doing breathing exercises.  Take deep breaths and cough every 1-2 hours. Your doctor may order a device called an Incentive Spirometer to help you take deep breaths. When coughing or sneezing, hold a pillow firmly against your incision with both hands. This is called "splinting." Doing this helps protect your incision. It also decreases belly discomfort.  If you are being admitted to the hospital overnight, leave your suitcase in the car. After surgery it may be brought to your room.  In case of increased patient census, it may be necessary for you, the patient, to continue your postoperative care in the Same Day Surgery department.  If you are being discharged the day of surgery, you will not be allowed to drive home. You will need a responsible individual to drive you home and stay with you for 24 hours after surgery.  If you are taking public transportation, you will need to have a responsible individual with you.  Please call the Pre-admissions Testing Dept. at [phone] if you have any questions about these instructions.  Surgery Visitation Policy:  Patients having surgery or a procedure may have two visitors.  Children under the age of 72 must have an adult with them who is not the patient.  Inpatient Visitation:     Visiting hours are 7 a.m. to 8 p.m. Up to four visitors are allowed at one time in a patient room. The visitors may rotate out with other people during the day.  One visitor age 69 or older may stay with the patient overnight and must be in the room by 8 p.m.    Preparing for Surgery with CHLORHEXIDINE GLUCONATE (CHG) Soap  Chlorhexidine Gluconate (CHG) Soap  o An antiseptic cleaner that kills germs and bonds with the skin to continue killing germs even after washing  o Used for showering the night before surgery and morning of surgery  Before surgery, you can play an important role by reducing the number of germs on your skin.  CHG (Chlorhexidine gluconate) soap is an antiseptic cleanser which kills germs and bonds with the skin to continue killing germs even after washing.  Please do not use if you have an allergy to CHG or antibacterial soaps. If your skin becomes reddened/irritated stop using the CHG.  1. Shower the NIGHT BEFORE SURGERY and the MORNING OF SURGERY with CHG soap.  2. If you choose to wash your hair, wash your hair first as usual with your normal shampoo.  3. After shampooing, rinse your hair and body thoroughly to remove the shampoo.  4. Use CHG as you would any other liquid soap. You can apply CHG directly to the skin and wash gently with a scrungie or a clean washcloth.  5. Apply the CHG soap to your body only from the neck down. Do not use on open wounds or open sores. Avoid contact with your eyes, ears, mouth, and genitals (private parts). Wash face and genitals (private parts) with your normal soap.  6. Wash thoroughly, paying special attention to the area where your surgery will be performed.  7. Thoroughly rinse your body with warm water.  8. Do not shower/wash with your normal soap after using and rinsing off the CHG soap.  9. Pat yourself dry with a clean towel.  10. Wear clean pajamas to bed the night before surgery.  12. Place clean sheets on  your bed the night of your first shower and do not sleep with pets.  13. Shower again with the CHG soap on the day of surgery prior to arriving at the hospital.  14. Do not apply any deodorants/lotions/powders.  15. Please wear clean clothes to the hospital.

## 2023-06-17 NOTE — H&P (Signed)
HISTORY OF PRESENT ILLNESS:   Jacqueline Murray is a 57 y.o.female patient who comes for follow up after bilateral breast MRI.  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 She presented again this year with nipple discharge. She was found with a 2 mm small mass in the right nipple. Excisional biopsy was done. This shows a small 2 mm sclerotic intraductal papilloma. No atypia or malignancy. 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 No malignant cells were identified on the pathology.   We decided to follow-up these area with breast MRI. The new breast MRI showed a 4 mm enhancement area on the base of the nipple. Radiology recommended biopsy but due to being so superficial, percutaneous biopsy is not feasible. Patient denies any nipple discharge since surgery.  PAST MEDICAL HISTORY:  Past Medical History:  Diagnosis Date  Allergy 2004  Anemia 2000  Arthritis  GERD (gastroesophageal reflux disease) 2004  History of blood transfusion 2000  Hyperlipidemia  Hypertension 2005     PAST SURGICAL HISTORY:  Past Surgical History:  Procedure Laterality Date  CESAREAN SECTION 06/17/1994  COLONOSCOPY 03/13/2010  @ [facility] - "benign polyps"  HYSTERECTOMY 01/28/2015  BREAST EXCISIONAL BIOPSY Right 06/05/2020  Dr Arrie Senate  COLONOSCOPY 09/25/2020  Normal colon/PHx CP/Repeat 16yrs/[facility]  MASTECTOMY PARTIAL Right 05/06/2022  Dr Arrie Senate -- w/ SN & RF  BREAST EXCISIONAL BIOPSY Right 03/31/2023  Dr. Arrie Senate    MEDICATIONS:  Outpatient Encounter Medications as of 05/20/2023  Medication Sig Dispense Refill  anastrozole (ARIMIDEX) 1 mg tablet Take 1 mg by mouth once daily  calcium carbonate (CALTRATE 600 ORAL) Take by mouth  loratadine (CLARITIN) 10 mg tablet Take 10 mg by mouth once daily  montelukast (SINGULAIR) 10 mg tablet Take 1 tablet (10 mg total) by mouth at bedtime 90 tablet 1   multivit/folic acid/vit K1 (ONE-A-DAY WOMEN'S 50 PLUS ORAL) Take 1 tablet by mouth once daily  simvastatin (ZOCOR) 20 MG tablet TAKE 1 TABLET(20 MG) BY MOUTH AT BEDTIME 90 tablet 1  spironolactone (ALDACTONE) 100 MG tablet TAKE 1 TABLET(100 MG) BY MOUTH EVERY DAY 90 tablet 3  triamcinolone (NASACORT AQ) 55 mcg nasal spray as needed  omeprazole (PRILOSEC) 20 MG DR capsule Take 20 mg by mouth once daily as needed (Patient not taking: Reported on 05/20/2023)   No facility-administered encounter medications on file as of 05/20/2023.    ALLERGIES:  Ace inhibitors, Povidone-iodine, Shellfish containing products, Latex, and Ramipril  SOCIAL HISTORY:  Social History   Socioeconomic History  Marital status: Divorced  Number of children: 1  Years of education: 18  Highest education level: Master's degree (e.g., MA, MS, MEng, MEd, MSW, MBA)  Occupational History  Occupation: Agricultural consultant  Comment: Dietitian  Tobacco Use  Smoking status: Never  Smokeless tobacco: Never  Vaping Use  Vaping status: Never Used  Substance and Sexual Activity  Alcohol use: Not Currently  Drug use: Never  Sexual activity: Not Currently  Partners: Male  Birth control/protection: None   Social Determinants of Health   Financial Resource Strain: Low Risk (04/12/2023)  Overall Financial Resource Strain (CARDIA)  Difficulty of Paying Living Expenses: Not hard at all  Food Insecurity: No Food Insecurity (04/12/2023)  Hunger Vital Sign  Worried About Running Out of Food in the Last Year: Never true  Ran Out of Food in the Last Year: Never true  Transportation Needs: No Transportation Needs (04/12/2023)  PRAPARE - Transportation  Lack of Transportation (Medical): No  Lack of Transportation (Non-Medical): No   FAMILY HISTORY:  Family History  Problem Relation Name Age of Onset  Heart disease Mother Reaver Diona Browner  Diabetes type I Mother Reaver Diona Browner  Coronary Artery Disease (Blocked arteries around heart)  Mother Reaver Diona Browner  Deceased  Diabetes type II Mother Reaver Diona Browner  Deceased  Diabetes Mother Reaver Diona Browner  Prostate cancer Father Gilford Rile  Deceased  High blood pressure (Hypertension) Father Gilford Rile  Deceased  Hyperlipidemia (Elevated cholesterol) Father Gilford Rile  Deceased  High blood pressure (Hypertension) Sister Rolla Plate  Hyperlipidemia (Elevated cholesterol) Sister Rolla Plate  Heart disease Brother Howie Ill  Diabetes type I Brother Darrell Orson Aloe  Kidney disease Brother Howie Ill  Coronary Artery Disease (Blocked arteries around heart) Brother Howie Ill  Deceased  Diabetes type II Brother Darrell Orson Aloe  Deceased  Diabetes Brother Howie Ill  No Known Problems Brother  No Known Problems Brother  No Known Problems Brother    GENERAL REVIEW OF SYSTEMS:   General ROS: negative for - chills, fatigue, fever, weight gain or weight loss Allergy and Immunology ROS: negative for - hives  Hematological and Lymphatic ROS: negative for - bleeding problems or bruising, negative for palpable nodes Endocrine ROS: negative for - heat or cold intolerance, hair changes Respiratory ROS: negative for - cough, shortness of breath or wheezing Cardiovascular ROS: no chest pain or palpitations GI ROS: negative for nausea, vomiting, abdominal pain, diarrhea, constipation Musculoskeletal ROS: negative for - joint swelling or muscle pain Neurological ROS: negative for - confusion, syncope Dermatological ROS: negative for pruritus and rash  PHYSICAL EXAM:  Vitals:  05/20/23 1041  BP: 110/82  Pulse: 83  .  Ht:157.5 cm (5' 2.01") Wt:61 kg (134 lb 7.7 oz) VWU:JWJX surface area is 1.63 meters squared. Body mass index is 24.59 kg/m.Marland Kitchen  GENERAL: Alert, active, oriented x3  BREAST: Right breast wound well-healed. No fluid collection. No palpable masses. No discharge.  EXTREMITIES: Well-developed well-nourished symmetrical with no  dependent edema.  NEUROLOGICAL: Awake alert oriented, facial expression symmetrical, moving all extremities.   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 2 mm sclerotic intraductal papilloma without atypia was identified. -Incidental finding of atypical intraepithelial cells worrisome for bilateral extension of underlying DCIS into lactiferous ducts of the nipple. Other than the atypical cells, no malignant cells were identified. -I personally discussed case with the pathologist. Recommendation is to do close follow-up with bilateral breast MRI to rule out underlying DCIS from these atypical cells. MRI indication is for workup of right breast DCIS with negative mammogram and ultrasound.  -Close MRI showed a new 4 mm enhancement area at the base of the areola biopsy was recommended. Percutaneous biopsy not feasible. I discussed case with the radiologist and these still could be postoperative changes versus malignancy such as DCIS.  -I discussed with patient the alternative of excisional biopsy versus observation. Excisional biopsy alternative are radiofrequency tag guided biopsy versus central duct excision biopsy.   Patient called back and agree with central duct excisional biopsy. She understand the benefits and risks of surgery.    PLAN:  Right breast central duct excisional biopsy ([id number])  Patient verbalized understanding, all questions were answered, and were agreeable with the plan outlined above.   Carolan Shiver, MD  Electronically signed by Carolan Shiver, MD

## 2023-06-17 NOTE — H&P (View-Only) (Signed)
HISTORY OF PRESENT ILLNESS:   Jacqueline Murray is a 57 y.o.female patient who comes for follow up after bilateral breast MRI.  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 She presented again this year with nipple discharge. She was found with a 2 mm small mass in the right nipple. Excisional biopsy was done. This shows a small 2 mm sclerotic intraductal papilloma. No atypia or malignancy. 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 No malignant cells were identified on the pathology.   We decided to follow-up these area with breast MRI. The new breast MRI showed a 4 mm enhancement area on the base of the nipple. Radiology recommended biopsy but due to being so superficial, percutaneous biopsy is not feasible. Patient denies any nipple discharge since surgery.  PAST MEDICAL HISTORY:  Past Medical History:  Diagnosis Date  Allergy 2004  Anemia 2000  Arthritis  GERD (gastroesophageal reflux disease) 2004  History of blood transfusion 2000  Hyperlipidemia  Hypertension 2005     PAST SURGICAL HISTORY:  Past Surgical History:  Procedure Laterality Date  CESAREAN SECTION 06/17/1994  COLONOSCOPY 03/13/2010  @ [facility] - "benign polyps"  HYSTERECTOMY 01/28/2015  BREAST EXCISIONAL BIOPSY Right 06/05/2020  Dr Takuya Lariccia Cintron  COLONOSCOPY 09/25/2020  Normal colon/PHx CP/Repeat 5yrs/[facility]  MASTECTOMY PARTIAL Right 05/06/2022  Dr Alma Muegge Cintron -- w/ SN & RF  BREAST EXCISIONAL BIOPSY Right 03/31/2023  Dr. Yussuf Sawyers Cintron    MEDICATIONS:  Outpatient Encounter Medications as of 05/20/2023  Medication Sig Dispense Refill  anastrozole (ARIMIDEX) 1 mg tablet Take 1 mg by mouth once daily  calcium carbonate (CALTRATE 600 ORAL) Take by mouth  loratadine (CLARITIN) 10 mg tablet Take 10 mg by mouth once daily  montelukast (SINGULAIR) 10 mg tablet Take 1 tablet (10 mg total) by mouth at bedtime 90 tablet 1   multivit/folic acid/vit K1 (ONE-A-DAY WOMEN'S 50 PLUS ORAL) Take 1 tablet by mouth once daily  simvastatin (ZOCOR) 20 MG tablet TAKE 1 TABLET(20 MG) BY MOUTH AT BEDTIME 90 tablet 1  spironolactone (ALDACTONE) 100 MG tablet TAKE 1 TABLET(100 MG) BY MOUTH EVERY DAY 90 tablet 3  triamcinolone (NASACORT AQ) 55 mcg nasal spray as needed  omeprazole (PRILOSEC) 20 MG DR capsule Take 20 mg by mouth once daily as needed (Patient not taking: Reported on 05/20/2023)   No facility-administered encounter medications on file as of 05/20/2023.    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  Tobacco Use  Smoking status: Never  Smokeless tobacco: Never  Vaping Use  Vaping status: Never Used  Substance and Sexual Activity  Alcohol use: Not Currently  Drug use: Never  Sexual activity: Not Currently  Partners: Male  Birth control/protection: None   Social Determinants of Health   Financial Resource Strain: Low Risk (04/12/2023)  Overall Financial Resource Strain (CARDIA)  Difficulty of Paying Living Expenses: Not hard at all  Food Insecurity: No Food Insecurity (04/12/2023)  Hunger Vital Sign  Worried About Running Out of Food in the Last Year: Never true  Ran Out of Food in the Last Year: Never true  Transportation Needs: No Transportation Needs (04/12/2023)  PRAPARE - Transportation    Lack of Transportation (Medical): No  Lack of Transportation (Non-Medical): No   FAMILY HISTORY:  Family History  Problem Relation Name Age of Onset  Heart disease Mother Reaver Gee  Diabetes type I Mother Reaver Gee  Coronary Artery Disease (Blocked arteries around heart)  Mother Reaver Gee  Deceased  Diabetes type II Mother Reaver Gee  Deceased  Diabetes Mother Reaver Gee  Prostate cancer Father Johnnie Gee  Deceased  High blood pressure (Hypertension) Father Johnnie Gee  Deceased  Hyperlipidemia (Elevated cholesterol) Father Johnnie Gee  Deceased  High blood pressure (Hypertension) Sister Deborah Gee Pratt  Hyperlipidemia (Elevated cholesterol) Sister Deborah Gee Pratt  Heart disease Brother Darrell Henderson  Diabetes type I Brother Darrell Henderson  Kidney disease Brother Darrell Henderson  Coronary Artery Disease (Blocked arteries around heart) Brother Darrell Henderson  Deceased  Diabetes type II Brother Darrell Henderson  Deceased  Diabetes Brother Darrell Henderson  No Known Problems Brother  No Known Problems Brother  No Known Problems Brother    GENERAL REVIEW OF SYSTEMS:   General ROS: negative for - chills, fatigue, fever, weight gain or weight loss Allergy and Immunology ROS: negative for - hives  Hematological and Lymphatic ROS: negative for - bleeding problems or bruising, negative for palpable nodes Endocrine ROS: negative for - heat or cold intolerance, hair changes Respiratory ROS: negative for - cough, shortness of breath or wheezing Cardiovascular ROS: no chest pain or palpitations GI ROS: negative for nausea, vomiting, abdominal pain, diarrhea, constipation Musculoskeletal ROS: negative for - joint swelling or muscle pain Neurological ROS: negative for - confusion, syncope Dermatological ROS: negative for pruritus and rash  PHYSICAL EXAM:  Vitals:  05/20/23 1041  BP: 110/82  Pulse: 83  .  Ht:157.5 cm (5' 2.01") Wt:61 kg (134 lb 7.7 oz) BSA:Body surface area is 1.63 meters squared. Body mass index is 24.59 kg/m..  GENERAL: Alert, active, oriented x3  BREAST: Right breast wound well-healed. No fluid collection. No palpable masses. No discharge.  EXTREMITIES: Well-developed well-nourished symmetrical with no  dependent edema.  NEUROLOGICAL: Awake alert oriented, facial expression symmetrical, moving all extremities.   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 2 mm sclerotic intraductal papilloma without atypia was identified. -Incidental finding of atypical intraepithelial cells worrisome for bilateral extension of underlying DCIS into lactiferous ducts of the nipple. Other than the atypical cells, no malignant cells were identified. -I personally discussed case with the pathologist. Recommendation is to do close follow-up with bilateral breast MRI to rule out underlying DCIS from these atypical cells. MRI indication is for workup of right breast DCIS with negative mammogram and ultrasound.  -Close MRI showed a new 4 mm enhancement area at the base of the areola biopsy was recommended. Percutaneous biopsy not feasible. I discussed case with the radiologist and these still could be postoperative changes versus malignancy such as DCIS.  -I discussed with patient the alternative of excisional biopsy versus observation. Excisional biopsy alternative are radiofrequency tag guided biopsy versus central duct excision biopsy.   Patient called back and agree with central duct excisional biopsy. She understand the benefits and risks of surgery.    PLAN:  Right breast central duct excisional biopsy ([id number])  Patient verbalized understanding, all questions were answered, and were agreeable with the plan outlined above.   Norina Cowper Cintron-Diaz, MD  Electronically signed by Arion Morgan Cintron-Diaz, MD  

## 2023-06-24 MED ORDER — CHLORHEXIDINE GLUCONATE 0.12 % MT SOLN
15.0000 mL | Freq: Once | OROMUCOSAL | Status: AC
  Administered 2023-06-25: 15 mL via OROMUCOSAL

## 2023-06-24 MED ORDER — LACTATED RINGERS IV SOLN: INTRAVENOUS | Status: DC

## 2023-06-24 MED ORDER — ORAL CARE MOUTH RINSE: 15.0000 mL | Freq: Once | OROMUCOSAL | Status: AC

## 2023-06-24 MED ORDER — CEFAZOLIN SODIUM-DEXTROSE 2-4 GM/100ML-% IV SOLN
2.0000 g | INTRAVENOUS | Status: AC
  Administered 2023-06-25: 2 g via INTRAVENOUS

## 2023-06-25 ENCOUNTER — Ambulatory Visit
Admission: RE | Admit: 2023-06-25 | Discharge: 2023-06-25 | Disposition: A | Discharge status: Home / Self Care | Payer: BC Managed Care – PPO | Attending: General Surgery | Admitting: General Surgery

## 2023-06-25 ENCOUNTER — Other Ambulatory Visit: Payer: Self-pay

## 2023-06-25 ENCOUNTER — Encounter
Admission: RE | Disposition: A | Discharge status: Home / Self Care | Payer: Self-pay | Source: Home / Self Care | Attending: General Surgery

## 2023-06-25 ENCOUNTER — Encounter: Payer: Self-pay | Admitting: General Surgery

## 2023-06-25 ENCOUNTER — Ambulatory Visit: Payer: BC Managed Care – PPO | Admitting: Anesthesiology

## 2023-06-25 DIAGNOSIS — N6489 Other specified disorders of breast: Secondary | ICD-10-CM | POA: Diagnosis present

## 2023-06-25 DIAGNOSIS — Z86000 Personal history of in-situ neoplasm of breast: Secondary | ICD-10-CM | POA: Diagnosis not present

## 2023-06-25 DIAGNOSIS — Z9011 Acquired absence of right breast and nipple: Secondary | ICD-10-CM | POA: Diagnosis not present

## 2023-06-25 DIAGNOSIS — D241 Benign neoplasm of right breast: Secondary | ICD-10-CM | POA: Insufficient documentation

## 2023-06-25 DIAGNOSIS — K219 Gastro-esophageal reflux disease without esophagitis: Secondary | ICD-10-CM | POA: Diagnosis not present

## 2023-06-25 DIAGNOSIS — Z79899 Other long term (current) drug therapy: Secondary | ICD-10-CM | POA: Insufficient documentation

## 2023-06-25 DIAGNOSIS — N6021 Fibroadenosis of right breast: Secondary | ICD-10-CM | POA: Insufficient documentation

## 2023-06-25 DIAGNOSIS — I10 Essential (primary) hypertension: Secondary | ICD-10-CM | POA: Insufficient documentation

## 2023-06-25 DIAGNOSIS — E7800 Pure hypercholesterolemia, unspecified: Secondary | ICD-10-CM | POA: Diagnosis not present

## 2023-06-25 HISTORY — PX: EXCISION OF BREAST BIOPSY: SHX5822

## 2023-06-25 SURGERY — EXCISION OF BREAST BIOPSY
Anesthesia: General | Site: Breast | Laterality: Right

## 2023-06-25 MED ORDER — OXYCODONE HCL 5 MG PO TABS
5.0000 mg | ORAL_TABLET | Freq: Once | ORAL | Status: AC
  Administered 2023-06-25: 5 mg via ORAL

## 2023-06-25 MED ORDER — CHLORHEXIDINE GLUCONATE 0.12 % MT SOLN
OROMUCOSAL | Status: AC
  Filled 2023-06-25: qty 15

## 2023-06-25 MED ORDER — ONDANSETRON HCL 4 MG/2ML IJ SOLN
INTRAMUSCULAR | Status: AC
  Filled 2023-06-25: qty 2

## 2023-06-25 MED ORDER — MIDAZOLAM HCL 2 MG/2ML IJ SOLN
INTRAMUSCULAR | Status: AC
  Filled 2023-06-25: qty 2

## 2023-06-25 MED ORDER — ACETAMINOPHEN 10 MG/ML IV SOLN
INTRAVENOUS | Status: DC | PRN
  Administered 2023-06-25: 1000 mg via INTRAVENOUS

## 2023-06-25 MED ORDER — OXYCODONE HCL 5 MG PO TABS
ORAL_TABLET | ORAL | Status: AC
  Filled 2023-06-25: qty 1

## 2023-06-25 MED ORDER — EPINEPHRINE PF 1 MG/ML IJ SOLN
INTRAMUSCULAR | Status: AC
  Filled 2023-06-25: qty 1

## 2023-06-25 MED ORDER — FENTANYL CITRATE (PF) 100 MCG/2ML IJ SOLN: 25.0000 ug | INTRAMUSCULAR | Status: DC | PRN

## 2023-06-25 MED ORDER — DEXAMETHASONE SODIUM PHOSPHATE 10 MG/ML IJ SOLN
INTRAMUSCULAR | Status: DC | PRN
  Administered 2023-06-25: 10 mg via INTRAVENOUS

## 2023-06-25 MED ORDER — FENTANYL CITRATE (PF) 100 MCG/2ML IJ SOLN
INTRAMUSCULAR | Status: AC
  Filled 2023-06-25: qty 2

## 2023-06-25 MED ORDER — LIDOCAINE HCL (CARDIAC) PF 100 MG/5ML IV SOSY
PREFILLED_SYRINGE | INTRAVENOUS | Status: DC | PRN
  Administered 2023-06-25: 50 mg via INTRAVENOUS

## 2023-06-25 MED ORDER — DROPERIDOL 2.5 MG/ML IJ SOLN: 0.6250 mg | Freq: Once | INTRAMUSCULAR | Status: DC | PRN

## 2023-06-25 MED ORDER — ACETAMINOPHEN 10 MG/ML IV SOLN
INTRAVENOUS | Status: AC
  Filled 2023-06-25: qty 100

## 2023-06-25 MED ORDER — PROPOFOL 10 MG/ML IV BOLUS
INTRAVENOUS | Status: DC | PRN
  Administered 2023-06-25: 200 mg via INTRAVENOUS

## 2023-06-25 MED ORDER — DEXMEDETOMIDINE HCL IN NACL 80 MCG/20ML IV SOLN
INTRAVENOUS | Status: DC | PRN
  Administered 2023-06-25: 16 ug via INTRAVENOUS

## 2023-06-25 MED ORDER — DEXMEDETOMIDINE HCL IN NACL 80 MCG/20ML IV SOLN
INTRAVENOUS | Status: AC
  Filled 2023-06-25: qty 20

## 2023-06-25 MED ORDER — PROPOFOL 1000 MG/100ML IV EMUL
INTRAVENOUS | Status: AC
  Filled 2023-06-25: qty 100

## 2023-06-25 MED ORDER — EPHEDRINE SULFATE (PRESSORS) 50 MG/ML IJ SOLN
INTRAMUSCULAR | Status: DC | PRN
  Administered 2023-06-25: 15 mg via INTRAVENOUS

## 2023-06-25 MED ORDER — CEFAZOLIN SODIUM-DEXTROSE 2-4 GM/100ML-% IV SOLN
INTRAVENOUS | Status: AC
  Filled 2023-06-25: qty 100

## 2023-06-25 MED ORDER — PROPOFOL 500 MG/50ML IV EMUL
INTRAVENOUS | Status: DC | PRN
  Administered 2023-06-25: 125 ug/kg/min via INTRAVENOUS

## 2023-06-25 MED ORDER — DEXAMETHASONE SODIUM PHOSPHATE 10 MG/ML IJ SOLN
INTRAMUSCULAR | Status: AC
  Filled 2023-06-25: qty 1

## 2023-06-25 MED ORDER — BUPIVACAINE HCL (PF) 0.5 % IJ SOLN
INTRAMUSCULAR | Status: AC
  Filled 2023-06-25: qty 30

## 2023-06-25 MED ORDER — ONDANSETRON HCL 4 MG/2ML IJ SOLN
INTRAMUSCULAR | Status: DC | PRN
  Administered 2023-06-25: 4 mg via INTRAVENOUS

## 2023-06-25 MED ORDER — BUPIVACAINE-EPINEPHRINE (PF) 0.5% -1:200000 IJ SOLN
INTRAMUSCULAR | Status: DC | PRN
  Administered 2023-06-25: 8 mL via PERINEURAL

## 2023-06-25 MED ORDER — LIDOCAINE HCL (PF) 2 % IJ SOLN
INTRAMUSCULAR | Status: AC
  Filled 2023-06-25: qty 5

## 2023-06-25 MED ORDER — PHENYLEPHRINE HCL (PRESSORS) 10 MG/ML IV SOLN
INTRAVENOUS | Status: DC | PRN
  Administered 2023-06-25 (×3): 80 ug via INTRAVENOUS

## 2023-06-25 MED ORDER — MIDAZOLAM HCL 2 MG/2ML IJ SOLN
INTRAMUSCULAR | Status: DC | PRN
  Administered 2023-06-25: 2 mg via INTRAVENOUS

## 2023-06-25 MED ORDER — EPHEDRINE 5 MG/ML INJ
INTRAVENOUS | Status: AC
  Filled 2023-06-25: qty 5

## 2023-06-25 MED ORDER — PHENYLEPHRINE 80 MCG/ML (10ML) SYRINGE FOR IV PUSH (FOR BLOOD PRESSURE SUPPORT)
PREFILLED_SYRINGE | INTRAVENOUS | Status: AC
  Filled 2023-06-25: qty 10

## 2023-06-25 SURGICAL SUPPLY — 44 items
ADH SKN CLS APL DERMABOND .7 (GAUZE/BANDAGES/DRESSINGS) ×1
APL PRP STRL LF DISP 70% ISPRP (MISCELLANEOUS) ×1
BLADE SURG 15 STRL LF DISP TIS (BLADE) ×1 IMPLANT
BLADE SURG 15 STRL SS (BLADE) ×1
CHLORAPREP W/TINT 26 (MISCELLANEOUS) ×1 IMPLANT
CNTNR URN SCR LID CUP LEK RST (MISCELLANEOUS) ×1 IMPLANT
CONT SPEC 4OZ STRL OR WHT (MISCELLANEOUS) ×1
DERMABOND ADVANCED .7 DNX12 (GAUZE/BANDAGES/DRESSINGS) ×1 IMPLANT
DEVICE DUBIN SPECIMEN MAMMOGRA (MISCELLANEOUS) ×1 IMPLANT
DRAPE LAPAROTOMY TRNSV 106X77 (MISCELLANEOUS) ×1 IMPLANT
ELECT CAUTERY BLADE TIP 2.5 (TIP) ×1
ELECT REM PT RETURN 9FT ADLT (ELECTROSURGICAL) ×1
ELECTRODE CAUTERY BLDE TIP 2.5 (TIP) ×1 IMPLANT
ELECTRODE REM PT RTRN 9FT ADLT (ELECTROSURGICAL) ×1 IMPLANT
GAUZE 4X4 16PLY ~~LOC~~+RFID DBL (SPONGE) ×1 IMPLANT
GLOVE BIO SURGEON STRL SZ 6.5 (GLOVE) ×1 IMPLANT
GLOVE BIOGEL PI IND STRL 6.5 (GLOVE) ×1 IMPLANT
GOWN STRL REUS W/ TWL LRG LVL3 (GOWN DISPOSABLE) ×3 IMPLANT
GOWN STRL REUS W/TWL LRG LVL3 (GOWN DISPOSABLE) ×3
KIT MARKER MARGIN INK (KITS) IMPLANT
KIT TURNOVER KIT A (KITS) ×1 IMPLANT
LABEL OR SOLS (LABEL) ×1 IMPLANT
MANIFOLD NEPTUNE II (INSTRUMENTS) ×1 IMPLANT
MARGIN MAP 10MM (MISCELLANEOUS) ×1 IMPLANT
MARKER MARGIN CORRECT CLIP (MARKER) IMPLANT
NDL HYPO 25X1 1.5 SAFETY (NEEDLE) ×1 IMPLANT
NEEDLE HYPO 25X1 1.5 SAFETY (NEEDLE) ×1 IMPLANT
PACK BASIN MINOR ARMC (MISCELLANEOUS) ×1 IMPLANT
RETRACTOR RING XSMALL (MISCELLANEOUS) IMPLANT
RTRCTR WOUND ALEXIS 13CM XS SH (MISCELLANEOUS)
SUT ETHILON 3-0 FS-10 30 BLK (SUTURE) ×1
SUT MNCRL 4-0 (SUTURE) ×1
SUT MNCRL 4-0 27XMFL (SUTURE) ×1
SUT SILK 2 0 SH (SUTURE) ×1 IMPLANT
SUT VIC AB 3-0 SH 27 (SUTURE) ×1
SUT VIC AB 3-0 SH 27X BRD (SUTURE) ×1 IMPLANT
SUTURE EHLN 3-0 FS-10 30 BLK (SUTURE) ×1 IMPLANT
SUTURE MNCRL 4-0 27XMF (SUTURE) ×1 IMPLANT
SYR 10ML LL (SYRINGE) ×1 IMPLANT
SYR BULB IRRIG 60ML STRL (SYRINGE) ×1 IMPLANT
TRAP FLUID SMOKE EVACUATOR (MISCELLANEOUS) ×1 IMPLANT
TRAP NEPTUNE SPECIMEN COLLECT (MISCELLANEOUS) ×1 IMPLANT
WATER STERILE IRR 1000ML POUR (IV SOLUTION) ×1 IMPLANT
WATER STERILE IRR 500ML POUR (IV SOLUTION) ×1 IMPLANT

## 2023-06-25 NOTE — Transfer of Care (Signed)
Immediate Anesthesia Transfer of Care Note  Patient: JACQUIE BANDO  Procedure(s) Performed: EXCISION OF BREAST BIOPSY (Right: Breast)  Patient Location: PACU  Anesthesia Type:General  Level of Consciousness: unresponsive  Airway & Oxygen Therapy: Patient Spontanous Breathing and Patient connected to face mask oxygen  Post-op Assessment: Report given to RN and Post -op Vital signs reviewed and stable  Post vital signs: Reviewed and stable  Last Vitals:  Vitals Value Taken Time  BP 81/56 06/25/23 1151  Temp    Pulse 65 06/25/23 1153  Resp 20 06/25/23 1153  SpO2 100 % 06/25/23 1153  Vitals shown include unvalidated device data.  Last Pain:  Vitals:   06/25/23 0850  TempSrc: Oral  PainSc: 0-No pain         Complications: No notable events documented.

## 2023-06-25 NOTE — Anesthesia Preprocedure Evaluation (Signed)
Anesthesia Evaluation  Patient identified by MRN, date of birth, ID band Patient awake    Reviewed: Allergy & Precautions, H&P , NPO status , Patient's Chart, lab work & pertinent test results, reviewed documented beta blocker date and time   History of Anesthesia Complications (+) PONV and history of anesthetic complications  Airway Mallampati: II   Neck ROM: full    Dental  (+) Poor Dentition, Dental Advidsory Given   Pulmonary neg pulmonary ROS   Pulmonary exam normal        Cardiovascular Exercise Tolerance: Good hypertension, On Medications (-) angina (-) Past MI and (-) Cardiac Stents Normal cardiovascular exam(-) dysrhythmias (-) Valvular Problems/Murmurs Rhythm:regular Rate:Normal     Neuro/Psych  Headaches, neg Seizures  negative psych ROS   GI/Hepatic Neg liver ROS,GERD  Medicated,,  Endo/Other  negative endocrine ROS    Renal/GU negative Renal ROS  negative genitourinary   Musculoskeletal   Abdominal   Peds  Hematology  (+) Blood dyscrasia, anemia   Anesthesia Other Findings Past Medical History: No date: Anemia No date: Arthritis 04/19/2020: Colon polyp No date: Complication of anesthesia No date: Ductal carcinoma in situ (DCIS) of right breast No date: Dysfunctional uterine bleeding No date: GERD (gastroesophageal reflux disease) No date: Headache No date: HLD (hyperlipidemia) No date: Hypertension No date: IBS (irritable bowel syndrome) 01/21/2012: Intraductal papilloma of breast     Comment:  left breast No date: Intraductal papilloma of breast, right No date: PONV (postoperative nausea and vomiting)     Comment:  EXTREME NAUSEA No date: Pure hypercholesterolemia Past Surgical History: 2016: ABDOMINAL HYSTERECTOMY few yrs ago: BREAST BIOPSY; Bilateral     Comment:  benign-cyst 1986: BREAST BIOPSY; Right     Comment:  fibroadenoma 05/17/2020: BREAST BIOPSY; Right     Comment:  neg/ MRI  bx 2 areas 03/09/2022: BREAST BIOPSY; Right     Comment:  rt breast stereo calcs x clip path pending 03/03/2023: BREAST BIOPSY; Right     Comment:  [place] RT BREAST BX W LOC DEV 1ST LESION IMG BX SPEC [place]               GUIDE 03/03/2023 [facility] 06/05/2020: BREAST BIOPSY WITH RADIO FREQUENCY LOCALIZER; Right     Comment:  Procedure: BREAST BIOPSY WITH RADIO FREQUENCY LOCALIZER;              Surgeon: Carolan Shiver, MD;  Location: [facility];               Service: General;  Laterality: Right; 03/09/2022: breast biposy; Right     Comment:  u/s core bc heart clip path pending 05/26/2020: BREAST EXCISIONAL BIOPSY; Right     Comment:  neg 2013: BREAST EXCISIONAL BIOPSY; Left     Comment:  neg 06/17/1994: CESAREAN SECTION 2011: COLONOSCOPY 09/25/2020: COLONOSCOPY 05/06/2022: PARTIAL MASTECTOMY WITH AXILLARY SENTINEL LYMPH NODE  BIOPSY; Right     Comment:  Procedure: PARTIAL MASTECTOMY WITH AXILLARY SENTINEL               LYMPH NODE BIOPSY With RF tag;  Surgeon: Carolan Shiver, MD;  Location: [facility];  Service: General;                Laterality: Right; BMI    Body Mass Index: 22.31 kg/m     Reproductive/Obstetrics negative OB ROS  Anesthesia Physical Anesthesia Plan  ASA: 2  Anesthesia Plan: General   Post-op Pain Management:    Induction: Intravenous  PONV Risk Score and Plan: TIVA, Ondansetron, Dexamethasone and Propofol infusion  Airway Management Planned: LMA  Additional Equipment:   Intra-op Plan:   Post-operative Plan:   Informed Consent: I have reviewed the patients History and Physical, chart, labs and discussed the procedure including the risks, benefits and alternatives for the proposed anesthesia with the patient or authorized representative who has indicated his/her understanding and acceptance.     Dental Advisory Given  Plan Discussed with: CRNA  Anesthesia Plan  Comments:        Anesthesia Quick Evaluation

## 2023-06-25 NOTE — Op Note (Signed)
Preoperative diagnosis: Right breast intraductal papilloma.  Postoperative diagnosis: Same.   Procedure: Right breast excisional biopsy.                      Anesthesia: GETA  Surgeon: Dr. Hazle Quant  Wound Classification: Clean  Indications: Patient is a 57 y.o. female with history of intraductal papilloma s/p excise.  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  Central duct excision biopsy was indicated to rule out DCIS.  Findings: 1. No other palpable mass or lymph node identified.   Description of procedure: The patient was taken to the operating room and placed supine on the operating table, and after general anesthesia the right chest was prepped and draped in the usual sterile fashion. A time-out was completed verifying correct patient, procedure, site, positioning, and implant(s) and/or special equipment prior to beginning this procedure.  A circumareolar skin incision around the areola was planned in such a way as to minimize the amount of dissection to reach the mass.  The skin incision was made. Flaps were raised.  Dissection was then taken down circumferentially around the central duct area. The specimen was removed. The specimen was oriented and sent to pathology. The wound was irrigated. Hemostasis was checked. The wound was closed with interrupted sutures of 3-0 Vicryl and a subcuticular suture of Monocryl 3-0. No attempt was made to close the dead space.   Specimen: Right central duct excisional biopsy              Complications: None  Estimated Blood Loss: 5 mL

## 2023-06-25 NOTE — Anesthesia Procedure Notes (Signed)
Procedure Name: LMA Insertion Date/Time: 06/25/2023 10:54 AM  Performed by: Jeannene Patella, CRNAPre-anesthesia Checklist: Patient identified, Emergency Drugs available, Suction available, Patient being monitored and Timeout performed Patient Re-evaluated:Patient Re-evaluated prior to induction Oxygen Delivery Method: Circle system utilized Preoxygenation: Pre-oxygenation with 100% oxygen Induction Type: IV induction LMA: LMA inserted LMA Size: 4.0 Tube type: Oral Number of attempts: 1 Placement Confirmation: positive ETCO2 and breath sounds checked- equal and bilateral Tube secured with: Tape Dental Injury: Teeth and Oropharynx as per pre-operative assessment

## 2023-06-25 NOTE — Interval H&P Note (Signed)
History and Physical Interval Note:  06/25/2023 10:28 AM  Jacqueline Murray  has presented today for surgery, with the diagnosis of D24.1 intraductal papilloma of breast, right.  The various methods of treatment have been discussed with the patient and family. After consideration of risks, benefits and other options for treatment, the patient has consented to  Procedure(s): EXCISION OF BREAST BIOPSY (Right) as a surgical intervention.  The patient's history has been reviewed, patient examined, no change in status, stable for surgery.  I have reviewed the patient's chart and labs.  Questions were answered to the patient's satisfaction.     Carolan Shiver

## 2023-06-25 NOTE — Anesthesia Postprocedure Evaluation (Signed)
Anesthesia Post Note  Patient: Jacqueline Murray  Procedure(s) Performed: EXCISION OF BREAST BIOPSY (Right: Breast)  Patient location during evaluation: PACU Anesthesia Type: General Level of consciousness: awake and alert Pain management: pain level controlled Vital Signs Assessment: post-procedure vital signs reviewed and stable Respiratory status: spontaneous breathing, nonlabored ventilation, respiratory function stable and patient connected to nasal cannula oxygen Cardiovascular status: blood pressure returned to baseline and stable Postop Assessment: no apparent nausea or vomiting Anesthetic complications: no   No notable events documented.   Last Vitals:  Vitals:   06/25/23 1246 06/25/23 1255  BP: 112/79 122/83  Pulse: 65 61  Resp: (!) 24   Temp:  (!) 36.1 C  SpO2: 100% 100%    Last Pain:  Vitals:   06/25/23 1255  TempSrc: Temporal  PainSc: 0-No pain                 Lenard Simmer

## 2023-06-25 NOTE — Discharge Instructions (Addendum)

## 2023-06-26 ENCOUNTER — Encounter: Payer: Self-pay | Admitting: General Surgery

## 2023-07-26 ENCOUNTER — Ambulatory Visit: Payer: BC Managed Care – PPO | Admitting: Radiation Oncology

## 2023-08-02 ENCOUNTER — Ambulatory Visit
Admission: RE | Admit: 2023-08-02 | Discharge: 2023-08-02 | Disposition: A | Discharge status: Home / Self Care | Payer: BC Managed Care – PPO | Source: Ambulatory Visit | Attending: Radiation Oncology | Admitting: Radiation Oncology

## 2023-08-02 ENCOUNTER — Encounter: Payer: Self-pay | Admitting: Radiation Oncology

## 2023-08-02 VITALS — BP 109/76 | HR 76 | Temp 98.1°F | Resp 14 | Wt 138.0 lb

## 2023-08-02 DIAGNOSIS — C50111 Malignant neoplasm of central portion of right female breast: Secondary | ICD-10-CM

## 2023-08-02 NOTE — Progress Notes (Signed)
Radiation Oncology Follow up Note  Name: Jacqueline Murray   Date:   08/02/2023 MRN:  [id number] DOB: October 23, 1966    This 57 y.o. female presents to the clinic today for 1 year follow-up status post whole breast radiation to her right breast for ER positive ductal carcinoma in situ.  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  Seen today in routine follow-up she is doing well.  She specifically denies breast tenderness cough or bone pain..  She had a mammogram back in March showing a 5 mm intraductal mass most likely an intraductal papilloma.  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  MRI scan showed a 4 to 5 mm enhancing mass at the base of the nipple which could represent residual DCIS she underwent in July another reexcision showing no evidence of atypia or malignancy.  She is doing well at this time.  COMPLICATIONS OF TREATMENT: none  FOLLOW UP COMPLIANCE: keeps appointments   PHYSICAL EXAM:  BP 109/76 (BP Location: Left Arm, Patient Position: Sitting)   Pulse 76   Temp 98.1 F (36.7 C) (Tympanic)   Resp 14   Wt 138 lb (62.6 kg)   BMI 23.69 kg/m  Lungs are clear to A&P cardiac examination essentially unremarkable with regular rate and rhythm. No dominant mass or nodularity is noted in either breast in 2 positions examined. Incision is well-healed. No axillary or supraclavicular adenopathy is appreciated. Cosmetic result is excellent.  Well-developed well-nourished patient in NAD. HEENT reveals PERLA, EOMI, discs not visualized.  Oral cavity is clear. No oral mucosal lesions are identified. Neck is clear without evidence of cervical or supraclavicular adenopathy. Lungs are clear to A&P. Cardiac examination is essentially unremarkable with  regular rate and rhythm without murmur rub or thrill. Abdomen is benign with no organomegaly or masses noted. Motor sensory and DTR levels are equal and symmetric in the upper and lower extremities. Cranial nerves II through XII are grossly intact. Proprioception is intact. No peripheral adenopathy or edema is identified. No motor or sensory levels are noted. Crude visual fields are within normal range.  RADIOLOGY RESULTS: MRI scans mammograms reviewed compatible with above-stated findings  PLAN: Present time patient is now about 1 year with no evidence of disease.  She had multiple biopsies showing no evidence of malignancy or residual DCIS.  I have asked to see her back in 1 year for follow-up.  Patient knows to call with any concerns.  She continues on Arimidex without side effect.  I would like to take this opportunity to thank you for allowing me to participate in the care of your patient.Carmina Miller, MD

## 2023-10-14 ENCOUNTER — Inpatient Hospital Stay (HOSPITAL_BASED_OUTPATIENT_CLINIC_OR_DEPARTMENT_OTHER): Payer: BC Managed Care – PPO | Admitting: Oncology

## 2023-10-14 ENCOUNTER — Inpatient Hospital Stay: Payer: BC Managed Care – PPO | Attending: Oncology

## 2023-10-14 ENCOUNTER — Encounter: Payer: Self-pay | Admitting: Oncology

## 2023-10-14 VITALS — BP 122/98 | HR 78 | Temp 97.8°F | Resp 18 | Wt 130.8 lb

## 2023-10-14 DIAGNOSIS — N6041 Mammary duct ectasia of right breast: Secondary | ICD-10-CM | POA: Insufficient documentation

## 2023-10-14 DIAGNOSIS — D0511 Intraductal carcinoma in situ of right breast: Secondary | ICD-10-CM | POA: Diagnosis present

## 2023-10-14 DIAGNOSIS — Z86018 Personal history of other benign neoplasm: Secondary | ICD-10-CM | POA: Insufficient documentation

## 2023-10-14 DIAGNOSIS — Z79811 Long term (current) use of aromatase inhibitors: Secondary | ICD-10-CM

## 2023-10-14 DIAGNOSIS — Z79899 Other long term (current) drug therapy: Secondary | ICD-10-CM | POA: Diagnosis not present

## 2023-10-14 DIAGNOSIS — Z841 Family history of disorders of kidney and ureter: Secondary | ICD-10-CM | POA: Diagnosis not present

## 2023-10-14 DIAGNOSIS — M8580 Other specified disorders of bone density and structure, unspecified site: Secondary | ICD-10-CM | POA: Insufficient documentation

## 2023-10-14 DIAGNOSIS — M2550 Pain in unspecified joint: Secondary | ICD-10-CM | POA: Insufficient documentation

## 2023-10-14 DIAGNOSIS — Z8349 Family history of other endocrine, nutritional and metabolic diseases: Secondary | ICD-10-CM | POA: Diagnosis not present

## 2023-10-14 DIAGNOSIS — Z90710 Acquired absence of both cervix and uterus: Secondary | ICD-10-CM | POA: Diagnosis not present

## 2023-10-14 DIAGNOSIS — Z170 Estrogen receptor positive status [ER+]: Secondary | ICD-10-CM | POA: Diagnosis not present

## 2023-10-14 DIAGNOSIS — Z83438 Family history of other disorder of lipoprotein metabolism and other lipidemia: Secondary | ICD-10-CM | POA: Diagnosis not present

## 2023-10-14 DIAGNOSIS — Z8042 Family history of malignant neoplasm of prostate: Secondary | ICD-10-CM | POA: Insufficient documentation

## 2023-10-14 DIAGNOSIS — Z860100 Personal history of colon polyps, unspecified: Secondary | ICD-10-CM | POA: Insufficient documentation

## 2023-10-14 DIAGNOSIS — Z8249 Family history of ischemic heart disease and other diseases of the circulatory system: Secondary | ICD-10-CM | POA: Insufficient documentation

## 2023-10-14 DIAGNOSIS — N6323 Unspecified lump in the left breast, lower outer quadrant: Secondary | ICD-10-CM | POA: Insufficient documentation

## 2023-10-14 DIAGNOSIS — Z833 Family history of diabetes mellitus: Secondary | ICD-10-CM | POA: Insufficient documentation

## 2023-10-14 LAB — CMP (CANCER CENTER ONLY)
ALT: 24 U/L (ref 0–44)
AST: 26 U/L (ref 15–41)
Albumin: 4.3 g/dL (ref 3.5–5.0)
Alkaline Phosphatase: 59 U/L (ref 38–126)
Anion gap: 6 (ref 5–15)
BUN: 16 mg/dL (ref 6–20)
CO2: 25 mmol/L (ref 22–32)
Calcium: 9.6 mg/dL (ref 8.9–10.3)
Chloride: 107 mmol/L (ref 98–111)
Creatinine: 1.02 mg/dL — ABNORMAL HIGH (ref 0.44–1.00)
GFR, Estimated: >60 mL/min (ref >60)
Glucose, Bld: 101 mg/dL — ABNORMAL HIGH (ref 70–99)
Potassium: 4.6 mmol/L (ref 3.5–5.1)
Sodium: 138 mmol/L (ref 135–145)
Total Bilirubin: 1.1 mg/dL (ref 0.3–1.2)
Total Protein: 7.3 g/dL (ref 6.5–8.1)

## 2023-10-14 LAB — CBC WITH DIFFERENTIAL (CANCER CENTER ONLY)
Abs Immature Granulocytes: 0.02 10*3/uL (ref 0.00–0.07)
Basophils Absolute: 0 10*3/uL (ref 0.0–0.1)
Basophils Relative: 1 %
Eosinophils Absolute: 0.2 10*3/uL (ref 0.0–0.5)
Eosinophils Relative: 2 %
HCT: 37.8 % (ref 36.0–46.0)
Hemoglobin: 11.9 g/dL — ABNORMAL LOW (ref 12.0–15.0)
Immature Granulocytes: 0 %
Lymphocytes Relative: 23 %
Lymphs Abs: 1.4 10*3/uL (ref 0.7–4.0)
MCH: 27.9 pg (ref 26.0–34.0)
MCHC: 31.5 g/dL (ref 30.0–36.0)
MCV: 88.7 fL (ref 80.0–100.0)
Monocytes Absolute: 0.5 10*3/uL (ref 0.1–1.0)
Monocytes Relative: 8 %
Neutro Abs: 4 10*3/uL (ref 1.7–7.7)
Neutrophils Relative %: 66 %
Platelet Count: 188 10*3/uL (ref 150–400)
RBC: 4.26 MIL/uL (ref 3.87–5.11)
RDW: 13.3 % (ref 11.5–15.5)
WBC Count: 6.2 10*3/uL (ref 4.0–10.5)
nRBC: 0 % (ref 0.0–0.2)

## 2023-10-14 NOTE — Progress Notes (Signed)
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 pTis pN0, status post lumpectomy followed by adjuvant radiation Patient is postmenopausal-FSH above 40 March 2024 Mammogram-5 mm intraductal mass in a focally dilated duct in the posterior aspect of the right nipple,s/p excisional biopsy 03/31/23  - atypical intraepithelial cells worrisome for pagetoid extension of underlying DCIS into lactiferous ducts of the nipple. MRI breast - Indeterminate 4-5 mm enhancing mass within/at the base of the right nipple 06/25/2023 s/p right breast lumpectomy, negative for atypia or malignancy  Labs are reviewed and discussed with patient. Continue Arimidex 1mg  daily. Rx sent   Aromatase inhibitor use Continue calcium and vitamin D supplementation.  History of osteopenia[2019], 08/07/22 DEXA normal bone density   Orders Placed This Encounter  Procedures   MM 3D DIAGNOSTIC MAMMOGRAM BILATERAL BREAST    Standing Status:   Future    Standing Expiration Date:   10/13/2024    Order Specific Question:   Reason for Exam (SYMPTOM  OR DIAGNOSIS REQUIRED)    Answer:   Breast Cancer    Order Specific Question:   Is the patient pregnant?    Answer:   No    Order Specific Question:   Preferred imaging location?    Answer:   [facility]   [place] LIMITED ULTRASOUND INCLUDING AXILLA RIGHT BREAST    Standing Status:   Future    Standing Expiration Date:   10/13/2024    Order Specific Question:   Reason for Exam (SYMPTOM  OR DIAGNOSIS REQUIRED)    Answer:    Breast Cancer    Order Specific Question:   Preferred imaging location?    Answer:   [facility]   [place] LIMITED ULTRASOUND INCLUDING AXILLA LEFT BREAST     Standing Status:   Future    Standing Expiration Date:   10/13/2024    Order Specific Question:   Reason for Exam (SYMPTOM  OR DIAGNOSIS REQUIRED)    Answer:   Breast Cancer    Order Specific Question:   Preferred imaging location?    Answer:   [facility]   CBC with Differential (Cancer Center Only)    Standing Status:   Future    Standing Expiration Date:   10/13/2024   CMP (Cancer Center only)    Standing Status:   Future    Standing Expiration Date:   10/13/2024   Follow up in  6 months.  All questions were answered. The patient knows to call the clinic with any problems, questions or concerns.  Rickard Patience, MD, PhD [facility] Hematology Oncology 10/14/2023   Mick Sell, MD    HISTORY OF PRESENTING ILLNESS:   Jacqueline Murray is a  57 y.o.  female presents for follow up of DCIS right breast  Patient has a history of papilloma in 2021. 04/29/2020, bilateral breast MRI showed suspicious clumped non-mass enhancement at 12:00 in the right breast, and an indeterminate suspicious mass at the 9:00 in the right breast. 05/17/2020, 9:00 breast mass positive for papilloma  with usual ductal hyperplasia.  12:00 mass was benign. 06/05/2020, right breast excisional biopsy showed benign breast tissue with intraductal papilloma, incidental fibroadenoma, fibrocystic changes, usual ductal hyperplasia, prior biopsy changes.  Negative for atypia and malignancy.  Right breast ductal excisional biopsy was negative for atypia and malignancy.  Patient complains of history of bloody right nipple discharge.  History of papilloma excised in 2021.  She also has a left breast mass was originally seen on 02/12/2020 study and being watched. 02/25/2022, bilateral breast mammogram showed Suspicious calcifications in the upper outer quadrant of  the right breast; intra ductal mass in the 3:00 region of the right breast 3 cm from nipple.;  Stable benign-appearing mass in the 3: 30 region of the left breast.  Sonographic evaluation of the right axilla does not show any enlarged adenopathy.  03/09/2022, patient underwent right breast 3:00 3 cm from the nipple, ultrasound-guided biopsy, pathology showed fragment of intraductal papilloma, with associated usual ductal hyperplasia.  Background mammary parenchyma with fibrocystic changes negative for atypical proliferative breast disease.;  Right upper outer quadrant calcification stereotactic core needle biopsy showed DCIS, high-grade with comedonecrosis.  Calcifications associated with DCIS.  Negative for invasive mammary carcinoma.  Patient has been seen by Dr. Maia Plan.  Recommended bilateral MRI for further evaluation of the extent of DCIS 03/20/2022, bilateral breast MRI 1. Large area of non mass enhancement within the superior central right breast adjacent to the marking clip, potentially representing additional DCIS adjacent to the recent site of biopsy. 2. Multiple enhancing masses are demonstrated throughout the right and left breast. These are indeterminate in etiology however may represent a background of papillomatosis. Additional etiologies not excluded. 3. Cortically thickened right axillary lymph node, indeterminate.  Patient was referred to establish care with oncology for further evaluation management.  Patient has no new complaints today.  Some soreness at the site of previous biopsy. Family history is positive for father with prostate cancer.  No family history of breast cancer.  Menarche 57 years of age Age at first birth 23, she has 1 daughter. Patient has taken OCP 20+ years. History of hysterectomy. + Hormone replacement therapy since 2016 /2017, recently stopped Previous right breast excisional biopsy-papilloma  03/20/2022, bilateral breast MRI showed additional non-mass  enhancement adjacent to the biopsy area, as well as multiple enhancing masses throughout the right and left breast.  Cortically thickened right axillary lymph node. Patient will get additional biopsy-MRI biopsy of the non-mass enhancement within the superior central right breast, dominant mass within the lower outer right breast, and dominant mass in the left breast.  Ultrasound for second look at the axillary lymph node.  04/09/22, additional fine-needle biopsy right breast superior central core needle biopsy showed usual ductal hyperplasia, columnar cells and fibrocystic changes.  Fat necrosis and foreign body giant cell reaction.  No malignancy identified. Right breast lower outer needle biopsy showed usual ductal hyperplasia and fibrocystic changes.  No malignancy identified. Left breast lower outer needle biopsy showed usual ductal hyperplasia and columnar cell and fibrocystic changes.  No malignancy identified. All were found to be concordant by Dr. Annia Belt.  04/30/2022, second look right axilla showed no suspicious right axillary lymphadenopathy.  Biopsy was canceled  05/06/2022, patient underwent lumpectomy. Right breast partial mastectomy showed a single foci of low-grade DCIS in situ, 2.5 mm, completely excised.  Single separated focus of atypical ductal hyperplasia.  Background benign mammary parenchyma with intraductal papilloma, focally sclerosing, fibrocystic changes, and florid usual ductal hyperplasia.  Negative for malignancy  and residual high-grade ductal carcinoma in situ.  Sentinel lymph node biopsy showed 4 lymph nodes all negative for malignancy.  Additional right breast excision showed benign mammary parenchyma with intraductal papilloma, associated florid usual ductal hyperplasia.  Negative for atypical perforated breast disease. pTis pN0, estrogen receptor 90% positive.  #Family history of cancer, Patient has establish care with genetic counselor and had [employer] done.   No pathological mutations.she has PALB2 VUS.   06/04/2022 - 06/26/2022 status post adjuvant radiation 07/13/22 started on Arimidex  08/07/22 DEXA normal bone density   03/01/2023 Mammogram showed 5 mm intraductal mass in a focally dilated duct in the posterior aspect of the right nipple,s/p excisional biopsy 03/31/23  - atypical intraepithelial cells worrisome for pagetoid extension of underlying DCIS into lactiferous ducts of the nipple.  03/31/23  - atypical intraepithelial cells worrisome for pagetoid extension of underlying DCIS into lactiferous ducts of the nipple. MRI breast - Indeterminate 4-5 mm enhancing mass within/at the base of the right nipple 06/25/2023 s/p right breast lumpectomy, negative for atypia or malignancy   INTERVAL HISTORY  Jacqueline Murray is a 57 y.o. female who has above history reviewed by me today presents for follow up visit for management of right breast DCIS She tolerates Arimidex 1mg  daily well, manageable arthralgia.  No new breast concerns.    Review of Systems  Constitutional:  Negative for appetite change, chills, fatigue and fever.  HENT:   Negative for hearing loss and voice change.   Eyes:  Negative for eye problems.  Respiratory:  Negative for chest tightness and cough.   Cardiovascular:  Negative for chest pain.  Gastrointestinal:  Negative for abdominal distention, abdominal pain and blood in stool.  Endocrine: Negative for hot flashes.  Genitourinary:  Negative for difficulty urinating and frequency.   Musculoskeletal:  Negative for arthralgias.  Skin:  Negative for itching and rash.  Neurological:  Negative for extremity weakness.  Hematological:  Negative for adenopathy.  Psychiatric/Behavioral:  Negative for confusion.     MEDICAL HISTORY:  Past Medical History:  Diagnosis Date   Anemia    Arthritis    Colon polyp 04/19/2020   Complication of anesthesia    Ductal carcinoma in situ (DCIS) of right breast    Dysfunctional uterine bleeding     GERD (gastroesophageal reflux disease)    Headache    HLD (hyperlipidemia)    Hypertension    IBS (irritable bowel syndrome)    Intraductal papilloma of breast 01/21/2012   left breast   Intraductal papilloma of breast, right    PONV (postoperative nausea and vomiting)    EXTREME NAUSEA   Pure hypercholesterolemia     SURGICAL HISTORY: Past Surgical History:  Procedure Laterality Date   ABDOMINAL HYSTERECTOMY  2016   BREAST BIOPSY Bilateral few yrs ago   benign-cyst   BREAST BIOPSY Right 1986   fibroadenoma   BREAST BIOPSY Right 05/17/2020   neg/ MRI bx 2 areas   BREAST BIOPSY Right 03/09/2022   rt breast stereo calcs x clip path pending   BREAST BIOPSY Right 03/03/2023   [place] RT BREAST BX W LOC DEV 1ST LESION IMG BX SPEC US GUIDE 03/03/2023 [facility]   BREAST BIOPSY WITH RADIO FREQUENCY LOCALIZER Right 06/05/2020   Procedure: BREAST BIOPSY WITH RADIO FREQUENCY LOCALIZER;  Surgeon: Carolan Shiver, MD;  Location: [facility];  Service: General;  Laterality: Right;   breast biposy Right 03/09/2022   u/s core bc heart clip path pending   BREAST EXCISIONAL BIOPSY  Right 05/26/2020   neg   BREAST EXCISIONAL BIOPSY Left 2013   neg   CESAREAN SECTION  06/17/1994   COLONOSCOPY  2011   COLONOSCOPY  09/25/2020   EXCISION OF BREAST BIOPSY Right 03/31/2023   Procedure: EXCISION OF BREAST BIOPSY;  Surgeon: Carolan Shiver, MD;  Location: [facility];  Service: General;  Laterality: Right;   EXCISION OF BREAST BIOPSY Right 06/25/2023   Procedure: EXCISION OF BREAST BIOPSY;  Surgeon: Carolan Shiver, MD;  Location: [facility];  Service: General;  Laterality: Right;   PARTIAL MASTECTOMY WITH AXILLARY SENTINEL LYMPH NODE BIOPSY Right 05/06/2022   Procedure: PARTIAL MASTECTOMY WITH AXILLARY SENTINEL LYMPH NODE BIOPSY With RF tag;  Surgeon: Carolan Shiver, MD;  Location: [facility];  Service: General;  Laterality: Right;    SOCIAL HISTORY: Social History    Socioeconomic History   Marital status: Divorced    Spouse name: Not on file   Number of children: 1   Years of education: Not on file   Highest education level: Not on file  Occupational History   Not on file  Tobacco Use   Smoking status: Never    Passive exposure: Never   Smokeless tobacco: Never  Vaping Use   Vaping status: Never Used  Substance and Sexual Activity   Alcohol use: Not Currently   Drug use: Never   Sexual activity: Not Currently  Other Topics Concern   Not on file  Social History Narrative   Lives alone   Social Determinants of Health   Financial Resource Strain: Low Risk  (04/12/2023)   [facility], [facility]   Overall Financial Resource Strain (CARDIA)    Difficulty of Paying Living Expenses: Not hard at all  Food Insecurity: No Food Insecurity (04/12/2023)   Received from [facility], [facility]   Hunger Vital Sign    Worried About Running Out of Food in the Last Year: Never true    Ran Out of Food in the Last Year: Never true  Transportation Needs: No Transportation Needs (04/12/2023)   Received from [facility], [facility]   [facility] - Transportation    In the past 12 months, has lack of transportation kept you from medical appointments or from getting medications?: No    Lack of Transportation (Non-Medical): No  Physical Activity: Not on file  Stress: Not on file  Social Connections: Not on file  Intimate Partner Violence: Not on file    FAMILY HISTORY: Family History  Problem Relation Age of Onset   Diabetes Mellitus II Mother    Heart disease Mother    Prostate cancer Father 61       metastatic   Hypertension Father    CAD Father    Hypertension Sister    Hyperlipidemia Sister    CAD Brother    Diabetes Mellitus II Brother    Heart disease Brother    Kidney disease Brother    Breast cancer Neg Hx      ALLERGIES:  is allergic to ace inhibitors, latex, povidone iodine, shellfish-derived products, and tape.  MEDICATIONS:  Current Outpatient Medications  Medication Sig Dispense Refill   anastrozole (ARIMIDEX) 1 MG tablet Take 1 tablet (1 mg total) by mouth daily. 90 tablet 1   Calcium Carbonate (CALTRATE 600 PO) Take 1,200 mg by mouth daily.     loratadine (CLARITIN) 10 MG tablet Take 10 mg by mouth at bedtime.  montelukast (SINGULAIR) 10 MG tablet Take 10 mg by mouth at bedtime.     Multiple Vitamins-Minerals (MULTIVITAMIN ADULT, MINERALS,) TABS Take 1 tablet by mouth at bedtime.     simvastatin (ZOCOR) 20 MG tablet Take 20 mg by mouth at bedtime.     spironolactone (ALDACTONE) 100 MG tablet Take 100 mg by mouth daily.     triamcinolone (NASACORT) 55 MCG/ACT AERO nasal inhaler Place 2 sprays into the nose daily.      No current facility-administered medications for this visit.     PHYSICAL EXAMINATION: ECOG PERFORMANCE STATUS: 0 - Asymptomatic Vitals:   10/14/23 1012  BP: (!) 122/98  Pulse: 78  Resp: 18  Temp: 97.8 F (36.6 C)   Filed Weights   10/14/23 1012  Weight: 130 lb 12.8 oz (59.3 kg)    Physical Exam Constitutional:      General: She is not in acute distress. HENT:     Head: Normocephalic and atraumatic.  Eyes:     General: No scleral icterus. Cardiovascular:     Rate and Rhythm: Normal rate and regular rhythm.     Heart sounds: Normal heart sounds.  Pulmonary:     Effort: Pulmonary effort is normal. No respiratory distress.     Breath sounds: No wheezing.  Abdominal:     General: Bowel sounds are normal. There is no distension.     Palpations: Abdomen is soft.  Musculoskeletal:        General: No deformity. Normal range of motion.     Cervical back: Normal range of motion and neck supple.  Skin:    General: Skin is warm and dry.     Findings: No erythema or rash.  Neurological:     Mental Status: She is alert and oriented to person, place, and  time. Mental status is at baseline.     Cranial Nerves: No cranial nerve deficit.     Coordination: Coordination normal.  Psychiatric:        Mood and Affect: Mood normal.      LABORATORY DATA:  I have reviewed the data as listed    Latest Ref Rng & Units 10/14/2023   10:01 AM 04/14/2023    9:55 AM 03/24/2023    9:11 AM  CBC  WBC 4.0 - 10.5 K/uL 6.2  6.6  5.8   Hemoglobin 12.0 - 15.0 g/dL 21.3  08.6  57.8   Hematocrit 36.0 - 46.0 % 37.8  39.5  36.8   Platelets 150 - 400 K/uL 188  226  231       Latest Ref Rng & Units 10/14/2023   10:00 AM 04/14/2023    9:55 AM 03/24/2023    9:11 AM  CMP  Glucose 70 - 99 mg/dL 469  629  528   BUN 6 - 20 mg/dL 16  17  20    Creatinine 0.44 - 1.00 mg/dL 4.13  2.44  0.10   Sodium 135 - 145 mmol/L 138  136  138   Potassium 3.5 - 5.1 mmol/L 4.6  4.5  4.2   Chloride 98 - 111 mmol/L 107  103  107   CO2 22 - 32 mmol/L 25  24  23    Calcium 8.9 - 10.3 mg/dL 9.6  9.6  9.4   Total Protein 6.5 - 8.1 g/dL 7.3  8.1    Total Bilirubin 0.3 - 1.2 mg/dL 1.1  1.1    Alkaline Phos 38 - 126 U/L 59  66    AST  15 - 41 U/L 26  27    ALT 0 - 44 U/L 24  30       RADIOGRAPHIC STUDIES: I have personally reviewed the radiological images as listed and agreed with the findings in the report. No results found.

## 2023-10-14 NOTE — Assessment & Plan Note (Signed)
Continue calcium and vitamin D supplementation.  History of osteopenia ][2019], 08/07/22 DEXA normal bone density

## 2023-10-14 NOTE — Progress Notes (Signed)
Pt here for follow up. No new concerns, no new breast problems 

## 2023-10-14 NOTE — Assessment & Plan Note (Addendum)
#  Right breast DCIS, ER positive. pTis pN0, status post lumpectomy followed by adjuvant radiation Patient is postmenopausal-FSH above 40 March 2024 Mammogram-5 mm intraductal mass in a focally dilated duct in the posterior aspect of the right nipple,s/p excisional biopsy 03/31/23  - atypical intraepithelial cells worrisome for pagetoid extension of underlying DCIS into lactiferous ducts of the nipple. MRI breast - Indeterminate 4-5 mm enhancing mass within/at the base of the right nipple 06/25/2023 s/p right breast lumpectomy, negative for atypia or malignancy  Labs are reviewed and discussed with patient. Continue Arimidex 1mg  daily. Rx sent

## 2023-10-20 ENCOUNTER — Other Ambulatory Visit: Payer: Self-pay | Admitting: Oncology

## 2024-03-01 ENCOUNTER — Other Ambulatory Visit: Payer: BC Managed Care – PPO

## 2024-03-28 ENCOUNTER — Ambulatory Visit
Admission: RE | Admit: 2024-03-28 | Discharge: 2024-03-28 | Disposition: A | Discharge status: Home / Self Care | Source: Ambulatory Visit | Attending: Oncology | Admitting: Oncology

## 2024-03-28 ENCOUNTER — Ambulatory Visit
Admission: RE | Admit: 2024-03-28 | Discharge: 2024-03-28 | Disposition: A | Discharge status: Home / Self Care | Source: Ambulatory Visit | Attending: Oncology

## 2024-03-28 DIAGNOSIS — D0511 Intraductal carcinoma in situ of right breast: Secondary | ICD-10-CM | POA: Diagnosis present

## 2024-04-11 ENCOUNTER — Other Ambulatory Visit: Payer: Self-pay | Admitting: Oncology

## 2024-04-12 IMAGING — US US BREAST*R* LIMITED INC AXILLA
2 series · 12 of 12 positions shown · non-contrast
Comparison: Previous exams.

CLINICAL DATA: Second-look ultrasound. Patient has biopsy-proven
DCIS (X clip) and an additional papilloma within the RIGHT breast.
Pre biopsy ultrasound of the axilla was unremarkable. Subsequent MRI
demonstrated non mass enhancement (biopsied at 2 sites with benign
results on [REDACTED]). Possible cortical thickening of a RIGHT
axillary lymph node was noted on MRI performed [DATE].

EXAM:
ULTRASOUND OF THE RIGHT BREAST

[Series 1: us breast*right* limited inc axilla · 0.06mm/px · 10 of 10 slices shown (1 of 2)]
[im 1/10]
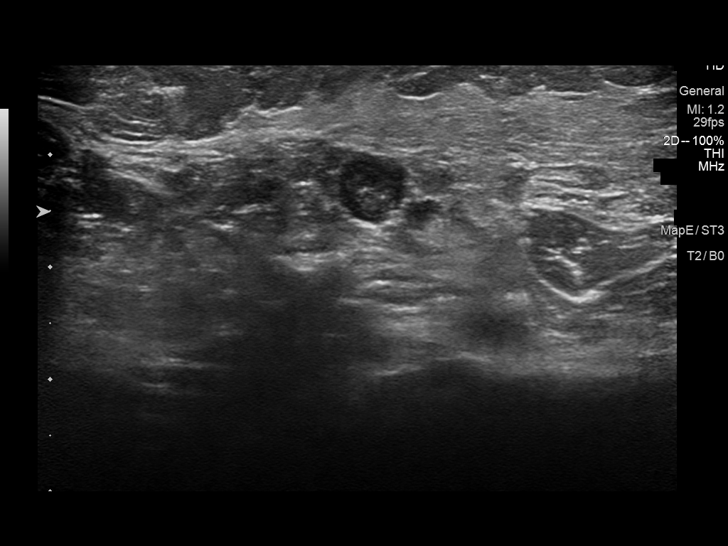
[im 2/10]
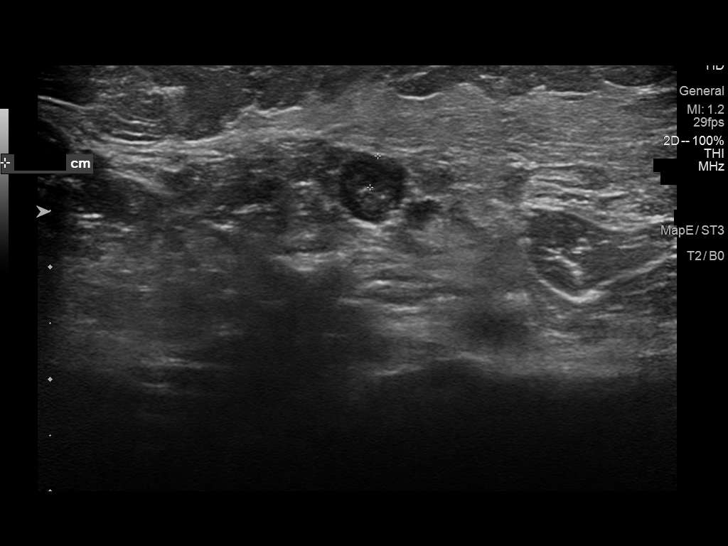
[im 3/10]
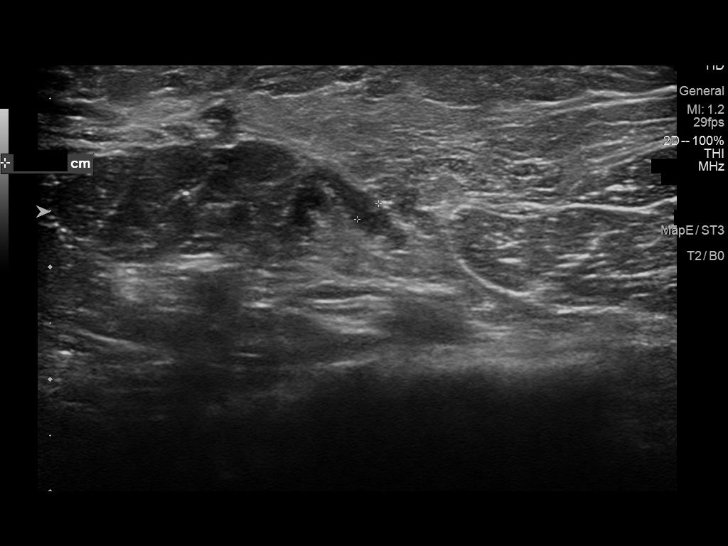
[im 4/10]
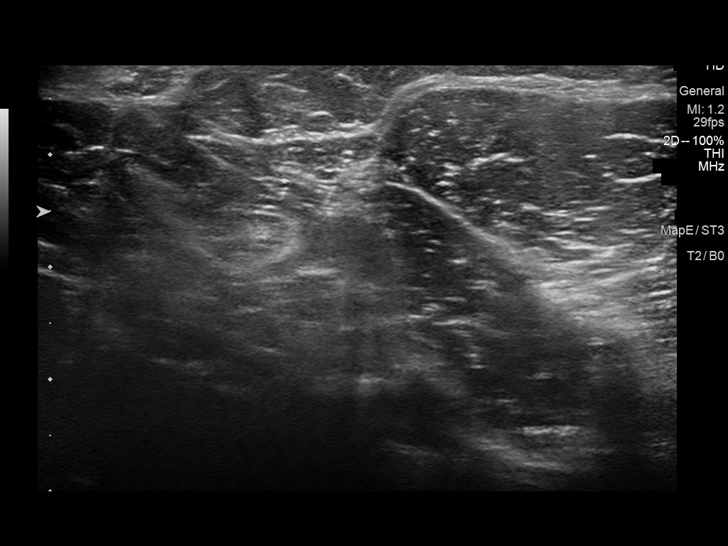
[im 5/10]
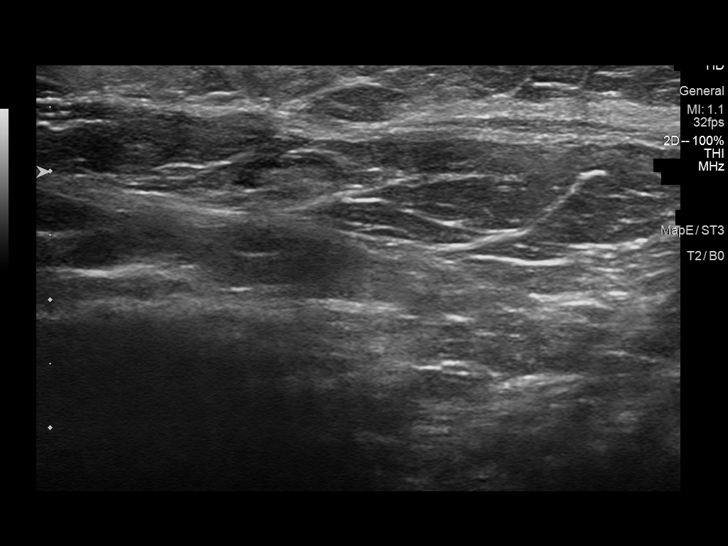
[im 6/10]
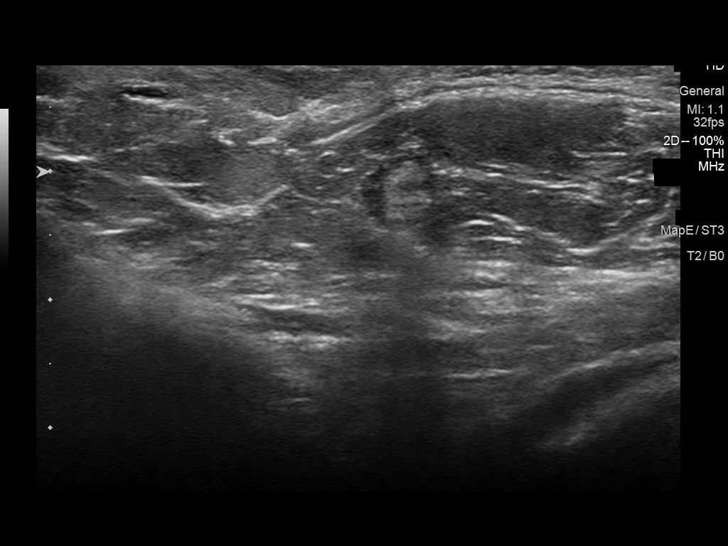
[im 7/10]
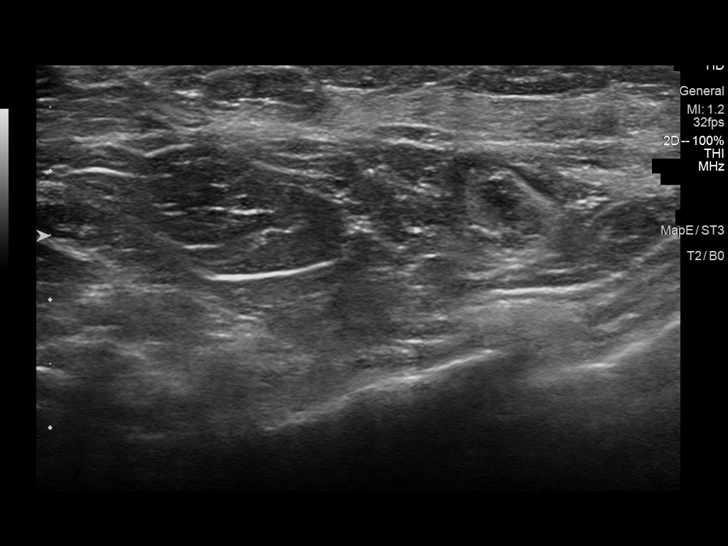
[im 8/10]
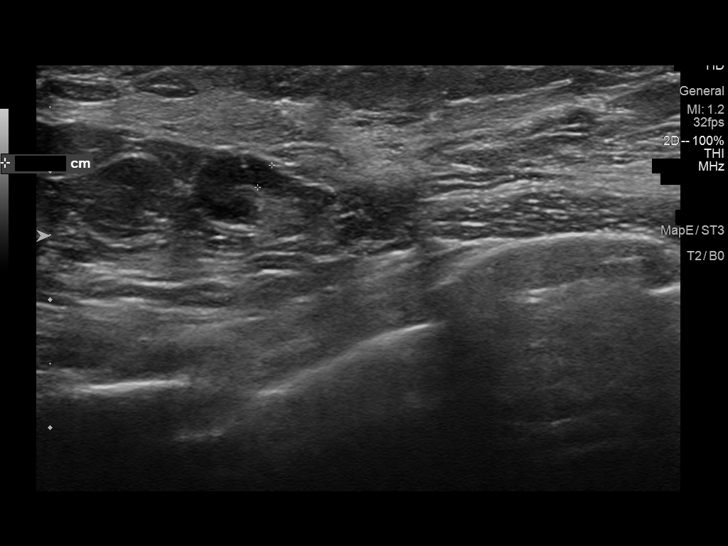
[im 9/10]
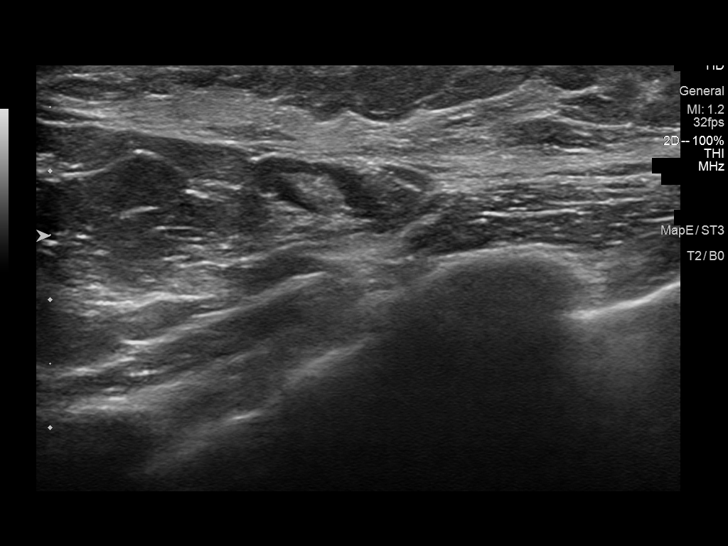
[im 10/10]
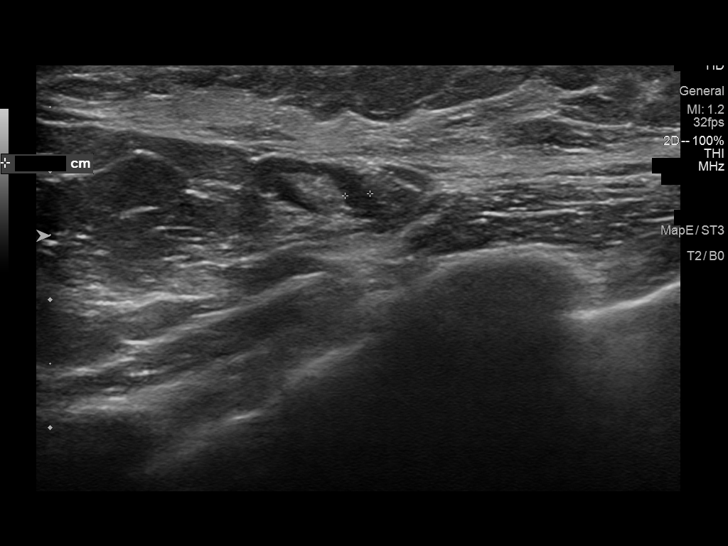

[Series 2: us breast*right* limited inc axilla · 0.07mm/px · 2 of 2 slices shown (2 of 2)]
[im 1/2]
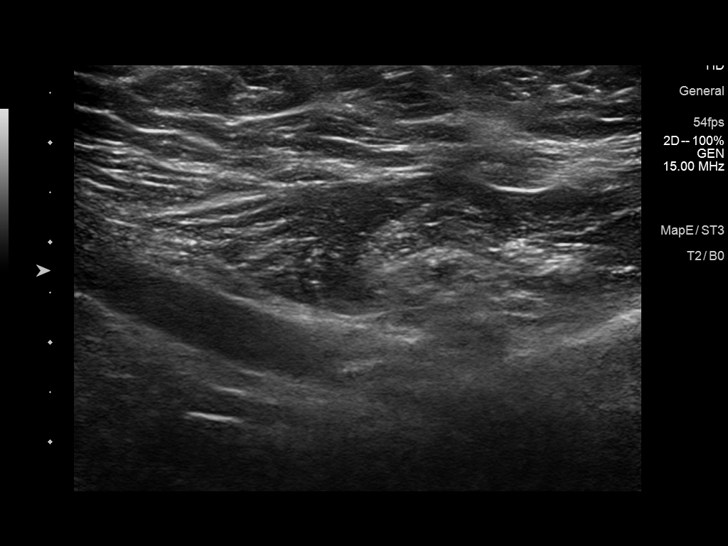
[im 2/2]
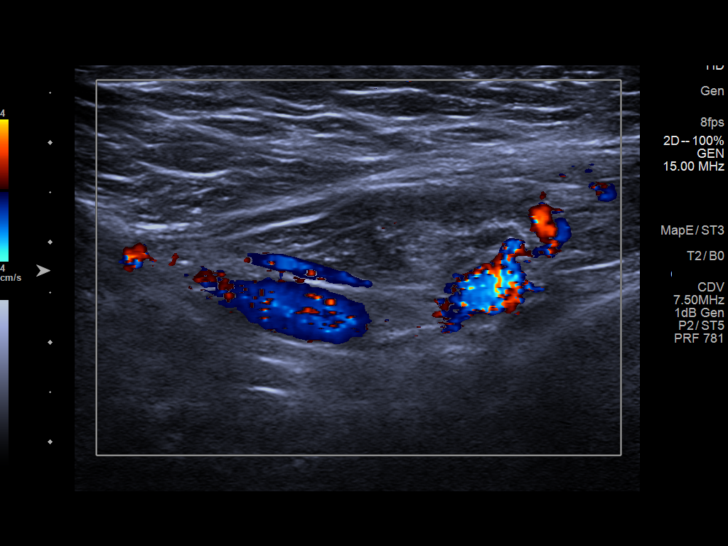

[12 of 12 positions shown; findings below may reference images not displayed]

FINDINGS: On physical exam, no suspicious adenopathy is appreciated.

Targeted ultrasound was performed of the RIGHT axilla. No suspicious
RIGHT axillary lymph nodes are identified. Lymph nodes demonstrate
normal echogenic hila and thin smooth cortices measuring 2-3 mm in
thickness. As such, ultrasound-guided biopsy was canceled.
IMPRESSION: No suspicious RIGHT axillary adenopathy. As such, biopsy was
canceled.

RECOMMENDATION:
Recommend continued surgical and clinical management of biopsy
proven RIGHT breast DCIS.

I have discussed the findings and recommendations with the patient.
If applicable, a reminder letter will be sent to the patient
regarding the next appointment.

BI-RADS CATEGORY  6: Known biopsy-proven malignancy.

## 2024-04-13 ENCOUNTER — Inpatient Hospital Stay: Payer: BC Managed Care – PPO | Attending: Oncology

## 2024-04-13 ENCOUNTER — Other Ambulatory Visit: Payer: Self-pay

## 2024-04-13 ENCOUNTER — Inpatient Hospital Stay

## 2024-04-13 ENCOUNTER — Inpatient Hospital Stay (HOSPITAL_BASED_OUTPATIENT_CLINIC_OR_DEPARTMENT_OTHER): Payer: BC Managed Care – PPO | Admitting: Oncology

## 2024-04-13 ENCOUNTER — Encounter: Payer: Self-pay | Admitting: Oncology

## 2024-04-13 VITALS — BP 115/87 | HR 68 | Temp 96.7°F | Resp 18 | Wt 124.7 lb

## 2024-04-13 DIAGNOSIS — D0511 Intraductal carcinoma in situ of right breast: Secondary | ICD-10-CM

## 2024-04-13 DIAGNOSIS — D649 Anemia, unspecified: Secondary | ICD-10-CM | POA: Insufficient documentation

## 2024-04-13 DIAGNOSIS — Z79811 Long term (current) use of aromatase inhibitors: Secondary | ICD-10-CM

## 2024-04-13 DIAGNOSIS — Z170 Estrogen receptor positive status [ER+]: Secondary | ICD-10-CM | POA: Diagnosis not present

## 2024-04-13 LAB — CBC WITH DIFFERENTIAL (CANCER CENTER ONLY)

## 2024-04-13 LAB — IRON AND TIBC
Iron: 87 ug/dL (ref 28–170)
Saturation Ratios: 25 % (ref 10.4–31.8)
TIBC: 343 ug/dL (ref 250–450)
UIBC: 256 ug/dL

## 2024-04-13 LAB — FERRITIN: Ferritin: 137 ng/mL (ref 11–307)

## 2024-04-13 LAB — VITAMIN B12: Vitamin B-12: 683 pg/mL (ref 180–914)

## 2024-04-13 LAB — FOLATE: Folate: 31 ng/mL (ref >5.9)

## 2024-04-13 MED ORDER — ANASTROZOLE 1 MG PO TABS: 1.0000 mg | ORAL_TABLET | Freq: Every day | ORAL | 1 refills | Status: DC

## 2024-04-13 NOTE — Assessment & Plan Note (Signed)
 Check iron tibc ferritin, B12, folate.  She declined CMP testing as she may have test done with PCP. Result is not available at the time of dictation.

## 2024-04-13 NOTE — Assessment & Plan Note (Signed)
#  Right breast DCIS, ER positive. pTis pN0, status post lumpectomy followed by adjuvant radiation Patient is postmenopausal-FSH above 40 March 2024 Mammogram-5 mm intraductal mass in a focally dilated duct in the posterior aspect of the right nipple,s/p excisional biopsy 03/31/23  - atypical intraepithelial cells worrisome for pagetoid extension of underlying DCIS into lactiferous ducts of the nipple. MRI breast - Indeterminate 4-5 mm enhancing mass within/at the base of the right nipple 06/25/2023 s/p right breast lumpectomy, negative for atypia or malignancy  Labs are reviewed and discussed with patient. Continue Arimidex  1mg  daily. Rx sent Continue annual mammogram surveillance, next due in April 2026

## 2024-04-13 NOTE — Progress Notes (Signed)
 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 pTis pN0, status post lumpectomy followed by adjuvant radiation Patient is postmenopausal-FSH above 40 March 2024 Mammogram-5 mm intraductal mass in a focally dilated duct in the posterior aspect of the right nipple,s/p excisional biopsy 03/31/23  - atypical intraepithelial cells worrisome for pagetoid extension of underlying DCIS into lactiferous ducts of the nipple. MRI breast - Indeterminate 4-5 mm enhancing mass within/at the base of the right nipple 06/25/2023 s/p right breast lumpectomy, negative for atypia or malignancy  Labs are reviewed and discussed with patient. Continue Arimidex  1mg  daily. Rx sent Continue annual mammogram surveillance, next due in April 2026   Aromatase inhibitor use Continue calcium and vitamin D supplementation.  History of osteopenia[2019], 08/07/22 DEXA normal bone density  Repeat DEXA  Normocytic anemia Check iron tibc ferritin, B12, folate.  She declined CMP testing as she may have test done with PCP. Result is not available at the time of dictation.    Orders Placed This Encounter  Procedures   DG Bone Density    Standing Status:   Future    Expected Date:   07/13/2024    Expiration Date:   04/13/2025    Reason for Exam (SYMPTOM  OR DIAGNOSIS REQUIRED):   on aromatase inhibitor, DCIS right breast    Is patient pregnant?:   No    Preferred imaging location?:   [facility]   Iron and TIBC    Standing Status:   Future    Number of  Occurrences:   1    Expected Date:   04/13/2024    Expiration Date:   04/13/2025   Folate    Standing Status:   Future    Number of Occurrences:   1    Expected Date:   04/13/2024    Expiration Date:   04/13/2025   Ferritin    Standing Status:   Future    Number of Occurrences:   1    Expected Date:   04/13/2024    Expiration Date:   04/13/2025   Vitamin B12    Standing Status:   Future    Number of Occurrences:   1    Expected Date:   04/13/2024    Expiration Date:   04/13/2025   CBC with Differential (Cancer Center Only)    Standing Status:   Future    Expected Date:   10/13/2024    Expiration Date:   04/13/2025   CMP (Cancer Center only)    Standing Status:   Future    Expected Date:   10/13/2024    Expiration Date:   04/13/2025   Follow up in  6 months.  All questions were answered. The patient knows to call the clinic with any problems, questions or concerns.  Timmy Forbes, MD, PhD [facility] Hematology Oncology 04/13/2024   Eartha Gold, MD    HISTORY OF PRESENTING ILLNESS:   Jacqueline Murray is a  58 y.o.  female presents for follow up of  DCIS right breast  Patient has a history of papilloma in 2021. 04/29/2020, bilateral breast MRI showed suspicious clumped non-mass enhancement at 12:00 in the right breast, and an indeterminate suspicious mass at the 9:00 in the right breast. 05/17/2020, 9:00 breast mass positive for papilloma with usual ductal hyperplasia.  12:00 mass was benign. 06/05/2020, right breast excisional biopsy showed benign breast tissue with intraductal papilloma, incidental fibroadenoma, fibrocystic changes, usual ductal hyperplasia, prior biopsy changes.  Negative for atypia and malignancy.  Right breast ductal excisional biopsy was negative for atypia and malignancy.  Patient complains of history of bloody right nipple discharge.  History of papilloma excised in 2021.  She also has a left breast mass was originally seen on 02/12/2020 study and being  watched. 02/25/2022, bilateral breast mammogram showed Suspicious calcifications in the upper outer quadrant of the right breast; intra ductal mass in the 3:00 region of the right breast 3 cm from nipple.;  Stable benign-appearing mass in the 3: 30 region of the left breast.  Sonographic evaluation of the right axilla does not show any enlarged adenopathy.  03/09/2022, patient underwent right breast 3:00 3 cm from the nipple, ultrasound-guided biopsy, pathology showed fragment of intraductal papilloma, with associated usual ductal hyperplasia.  Background mammary parenchyma with fibrocystic changes negative for atypical proliferative breast disease.;  Right upper outer quadrant calcification stereotactic core needle biopsy showed DCIS, high-grade with comedonecrosis.  Calcifications associated with DCIS.  Negative for invasive mammary carcinoma.  Patient has been seen by Dr. Charmel Cooter.  Recommended bilateral MRI for further evaluation of the extent of DCIS 03/20/2022, bilateral breast MRI 1. Large area of non mass enhancement within the superior central right breast adjacent to the marking clip, potentially representing additional DCIS adjacent to the recent site of biopsy. 2. Multiple enhancing masses are demonstrated throughout the right and left breast. These are indeterminate in etiology however may represent a background of papillomatosis. Additional etiologies not excluded. 3. Cortically thickened right axillary lymph node, indeterminate.  Patient was referred to establish care with oncology for further evaluation management.  Patient has no new complaints today.  Some soreness at the site of previous biopsy. Family history is positive for father with prostate cancer.  No family history of breast cancer.  Menarche 58 years of age Age at first birth 14, she has 1 daughter. Patient has taken OCP 20+ years. History of hysterectomy. + Hormone replacement therapy since 2016 /2017, recently  stopped Previous right breast excisional biopsy-papilloma  03/20/2022, bilateral breast MRI showed additional non-mass enhancement adjacent to the biopsy area, as well as multiple enhancing masses throughout the right and left breast.  Cortically thickened right axillary lymph node. Patient will get additional biopsy-MRI biopsy of the non-mass enhancement within the superior central right breast, dominant mass within the lower outer right breast, and dominant mass in the left breast.  Ultrasound for second look at the axillary lymph node.  04/09/22, additional fine-needle biopsy right breast superior central core needle biopsy showed usual ductal hyperplasia, columnar cells and fibrocystic changes.  Fat necrosis and foreign body giant cell reaction.  No malignancy identified. Right breast lower outer needle biopsy showed usual ductal hyperplasia and fibrocystic changes.  No malignancy identified. Left breast lower outer needle biopsy showed usual ductal hyperplasia and columnar cell and fibrocystic changes.  No malignancy identified. All were found to be concordant by Dr. Jone Neither.  04/30/2022, second look right axilla showed no suspicious right axillary lymphadenopathy.  Biopsy was canceled  05/06/2022, patient underwent lumpectomy.  Right breast partial mastectomy showed a single foci of low-grade DCIS in situ, 2.5 mm, completely excised.  Single separated focus of atypical ductal hyperplasia.  Background benign mammary parenchyma with intraductal papilloma, focally sclerosing, fibrocystic changes, and florid usual ductal hyperplasia.  Negative for malignancy and residual high-grade ductal carcinoma in situ.  Sentinel lymph node biopsy showed 4 lymph nodes all negative for malignancy.  Additional right breast excision showed benign mammary parenchyma with intraductal papilloma, associated florid usual ductal hyperplasia.  Negative for atypical perforated breast disease. pTis pN0, estrogen receptor 90%  positive.  #Family history of cancer, Patient has establish care with genetic counselor and had [employer] done.  No pathological mutations.she has PALB2 VUS.   06/04/2022 - 06/26/2022 status post adjuvant radiation 07/13/22 started on Arimidex   08/07/22 DEXA normal bone density   03/01/2023 Mammogram showed 5 mm intraductal mass in a focally dilated duct in the posterior aspect of the right nipple,s/p excisional biopsy 03/31/23  - atypical intraepithelial cells worrisome for pagetoid extension of underlying DCIS into lactiferous ducts of the nipple.  03/31/23  - atypical intraepithelial cells worrisome for pagetoid extension of underlying DCIS into lactiferous ducts of the nipple. MRI breast - Indeterminate 4-5 mm enhancing mass within/at the base of the right nipple 06/25/2023 s/p right breast lumpectomy, negative for atypia or malignancy   INTERVAL HISTORY  LARESA OSHIRO is a 58 y.o. female who has above history reviewed by me today presents for follow up visit for management of right breast DCIS She tolerates Arimidex  1mg  daily well, manageable arthralgia.  No new breast concerns.    Review of Systems  Constitutional:  Negative for appetite change, chills, fatigue and fever.  HENT:   Negative for hearing loss and voice change.   Eyes:  Negative for eye problems.  Respiratory:  Negative for chest tightness and cough.   Cardiovascular:  Negative for chest pain.  Gastrointestinal:  Negative for abdominal distention, abdominal pain and blood in stool.  Endocrine: Negative for hot flashes.  Genitourinary:  Negative for difficulty urinating and frequency.   Musculoskeletal:  Negative for arthralgias.  Skin:  Negative for itching and rash.  Neurological:  Negative for extremity weakness.  Hematological:  Negative for adenopathy.  Psychiatric/Behavioral:  Negative for confusion.     MEDICAL HISTORY:  Past Medical History:  Diagnosis Date   Anemia    Arthritis    Colon polyp  04/19/2020   Complication of anesthesia    Ductal carcinoma in situ (DCIS) of right breast    Dysfunctional uterine bleeding    GERD (gastroesophageal reflux disease)    Headache    HLD (hyperlipidemia)    Hypertension    IBS (irritable bowel syndrome)    Intraductal papilloma of breast 01/21/2012   left breast   Intraductal papilloma of breast, right    PONV (postoperative nausea and vomiting)    EXTREME NAUSEA   Pure hypercholesterolemia     SURGICAL HISTORY: Past Surgical History:  Procedure Laterality Date   ABDOMINAL HYSTERECTOMY  2016   BREAST BIOPSY Bilateral few yrs ago   benign-cyst   BREAST BIOPSY Right 1986   fibroadenoma   BREAST BIOPSY Right 05/17/2020   neg/ MRI bx 2 areas   BREAST BIOPSY Right 03/09/2022   rt breast stereo calcs x clip path pending   BREAST BIOPSY Right 03/03/2023   US  RT BREAST BX W LOC DEV 1ST LESION IMG BX SPEC US  GUIDE 03/03/2023 [facility]   BREAST BIOPSY WITH RADIO FREQUENCY LOCALIZER  Right 06/05/2020   Procedure: BREAST BIOPSY WITH RADIO FREQUENCY LOCALIZER;  Surgeon: Eldred Grego, MD;  Location: [facility];  Service: General;  Laterality: Right;   breast biposy Right 03/09/2022   u/s core bc heart clip path pending   BREAST EXCISIONAL BIOPSY Right 05/26/2020   neg   BREAST EXCISIONAL BIOPSY Left 2013   neg   CESAREAN SECTION  06/17/1994   COLONOSCOPY  2011   COLONOSCOPY  09/25/2020   EXCISION OF BREAST BIOPSY Right 03/31/2023   Procedure: EXCISION OF BREAST BIOPSY;  Surgeon: Eldred Grego, MD;  Location: [facility];  Service: General;  Laterality: Right;   EXCISION OF BREAST BIOPSY Right 06/25/2023   Procedure: EXCISION OF BREAST BIOPSY;  Surgeon: Eldred Grego, MD;  Location: [facility];  Service: General;  Laterality: Right;   PARTIAL MASTECTOMY WITH AXILLARY SENTINEL LYMPH NODE BIOPSY Right 05/06/2022   Procedure: PARTIAL MASTECTOMY WITH AXILLARY SENTINEL LYMPH NODE BIOPSY With RF tag;  Surgeon:  Eldred Grego, MD;  Location: [facility];  Service: General;  Laterality: Right;    SOCIAL HISTORY: Social History   Socioeconomic History   Marital status: Divorced    Spouse name: Not on file   Number of children: 1   Years of education: Not on file   Highest education level: Not on file  Occupational History   Not on file  Tobacco Use   Smoking status: Never    Passive exposure: Never   Smokeless tobacco: Never  Vaping Use   Vaping status: Never Used  Substance and Sexual Activity   Alcohol use: Not Currently   Drug use: Never   Sexual activity: Not Currently  Other Topics Concern   Not on file  Social History Narrative   Lives alone   Social Drivers of Health   Financial Resource Strain: Low Risk  (04/12/2024)   Received from [facility]   Overall Financial Resource Strain (CARDIA)    Difficulty of Paying Living Expenses: Not hard at all  Food Insecurity: No Food Insecurity (04/12/2024)   Received from [facility]   Hunger Vital Sign    Worried About Running Out of Food in the Last Year: Never true    Ran Out of Food in the Last Year: Never true  Transportation Needs: No Transportation Needs (04/12/2024)   Received from [facility]   PRAPARE - Transportation    In the past 12 months, has lack of transportation kept you from medical appointments or from getting medications?: No    Lack of Transportation (Non-Medical): No  Physical Activity: Not on file  Stress: Not on file  Social Connections: Not on file  Intimate Partner Violence: Not on file    FAMILY HISTORY: Family History  Problem Relation Age of Onset   Diabetes Mellitus II Mother    Heart disease Mother    Prostate cancer Father 76       metastatic   Hypertension Father    CAD Father    Hypertension Sister    Hyperlipidemia Sister    CAD Brother    Diabetes Mellitus II Brother    Heart disease Brother    Kidney disease Brother     Breast cancer Neg Hx     ALLERGIES:  is allergic to ace inhibitors, latex, povidone iodine, shellfish-derived products, and tape.  MEDICATIONS:  Current Outpatient Medications  Medication Sig Dispense Refill   Calcium Carbonate (CALTRATE 600 PO) Take 1,200 mg by mouth daily.  loratadine (CLARITIN) 10 MG tablet Take 10 mg by mouth at bedtime.     montelukast (SINGULAIR) 10 MG tablet Take 10 mg by mouth at bedtime.     Multiple Vitamins-Minerals (MULTIVITAMIN ADULT, MINERALS,) TABS Take 1 tablet by mouth at bedtime.     simvastatin (ZOCOR) 20 MG tablet Take 20 mg by mouth at bedtime.     spironolactone (ALDACTONE) 100 MG tablet Take 100 mg by mouth daily.     triamcinolone  (NASACORT ) 55 MCG/ACT AERO nasal inhaler Place 2 sprays into the nose daily.      anastrozole  (ARIMIDEX ) 1 MG tablet Take 1 tablet (1 mg total) by mouth daily. 90 tablet 1   No current facility-administered medications for this visit.     PHYSICAL EXAMINATION: ECOG PERFORMANCE STATUS: 0 - Asymptomatic Vitals:   04/13/24 1029  BP: 115/87  Pulse: 68  Resp: 18  Temp: (!) 96.7 F (35.9 C)  SpO2: 99%   Filed Weights   04/13/24 1029  Weight: 124 lb 11.2 oz (56.6 kg)    Physical Exam Constitutional:      General: She is not in acute distress. HENT:     Head: Normocephalic and atraumatic.  Eyes:     General: No scleral icterus. Cardiovascular:     Rate and Rhythm: Normal rate and regular rhythm.     Heart sounds: Normal heart sounds.  Pulmonary:     Effort: Pulmonary effort is normal. No respiratory distress.     Breath sounds: No wheezing.  Abdominal:     General: Bowel sounds are normal. There is no distension.     Palpations: Abdomen is soft.  Musculoskeletal:        General: No deformity. Normal range of motion.     Cervical back: Normal range of motion and neck supple.  Skin:    General: Skin is warm and dry.     Findings: No erythema or rash.  Neurological:     Mental Status: She is alert  and oriented to person, place, and time. Mental status is at baseline.     Cranial Nerves: No cranial nerve deficit.     Coordination: Coordination normal.  Psychiatric:        Mood and Affect: Mood normal.      LABORATORY DATA:  I have reviewed the data as listed    Latest Ref Rng & Units 04/13/2024   10:22 AM 10/14/2023   10:01 AM 04/14/2023    9:55 AM  CBC  WBC K/uL TEST CANCELLED PER RN  6.2  6.6   Hemoglobin g/dL TEST CANCELLED PER RN  11.9  12.6   Hematocrit % TEST CANCELLED PER RN  37.8  39.5   Platelets K/uL TEST CANCELLED PER RN  188  226       Latest Ref Rng & Units 10/14/2023   10:00 AM 04/14/2023    9:55 AM 03/24/2023    9:11 AM  CMP  Glucose 70 - 99 mg/dL 454  098  119   BUN 6 - 20 mg/dL 16  17  20    Creatinine 0.44 - 1.00 mg/dL 1.47  8.29  5.62   Sodium 135 - 145 mmol/L 138  136  138   Potassium 3.5 - 5.1 mmol/L 4.6  4.5  4.2   Chloride 98 - 111 mmol/L 107  103  107   CO2 22 - 32 mmol/L 25  24  23    Calcium 8.9 - 10.3 mg/dL 9.6  9.6  9.4  Total Protein 6.5 - 8.1 g/dL 7.3  8.1    Total Bilirubin 0.3 - 1.2 mg/dL 1.1  1.1    Alkaline Phos 38 - 126 U/L 59  66    AST 15 - 41 U/L 26  27    ALT 0 - 44 U/L 24  30       RADIOGRAPHIC STUDIES: I have personally reviewed the radiological images as listed and agreed with the findings in the report. MM 3D DIAGNOSTIC MAMMOGRAM BILATERAL BREAST Result Date: 03/28/2024 CLINICAL DATA:  Annual examination status post RIGHT breast lumpectomy in May 2023 for DCIS with adjuvant radiation therapy. She underwent excisional biopsy of a mass within the RIGHT nipple in April 2024, with pathology demonstrating signs worrisome for pagetoid extension of underlying ductal carcinoma in-situ into lactiferous ducts of the nipple. The patient underwent a subsequent right breast lumpectomy of this area in July 2024, with no evidence of atypia or malignancy. Patient also has history of excised right breast papilloma in 2021. EXAM:.: EXAM:. DIGITAL  DIAGNOSTIC BILATERAL MAMMOGRAM WITH TOMOSYNTHESIS AND CAD TECHNIQUE: Bilateral digital diagnostic mammography and breast tomosynthesis was performed. The images were evaluated with computer-aided detection. COMPARISON:  Previous exam(s). ACR Breast Density Category c: The breasts are heterogeneously dense, which may obscure small masses. FINDINGS: Interval postsurgical changes in the retroareolar right breast, including slightly increased nipple retraction and architectural distortion, consistent with interval excisional biopsy and lumpectomy. Stable postsurgical changes in the upper-outer right breast middle depth. No new suspicious mass, calcification, or other findings are identified in bilateral breasts. IMPRESSION: 1. No mammographic evidence of malignancy in bilateral breasts. 2. Interval posttreatment changes in the retroareolar right breast and stable posttreatment changes in the upper-outer right breast. RECOMMENDATION: Bilateral diagnostic mammogram in 12 months to continue with post lumpectomy surveillance protocol. I have discussed the findings and recommendations with the patient. If applicable, a reminder letter will be sent to the patient regarding the next appointment. BI-RADS CATEGORY  2: Benign. Electronically Signed   By: Sande Cromer M.D.   On: 03/28/2024 11:41

## 2024-04-13 NOTE — Assessment & Plan Note (Addendum)
 Continue calcium and vitamin D supplementation.  History of osteopenia[2019], 08/07/22 DEXA normal bone density  Repeat DEXA

## 2024-07-31 ENCOUNTER — Telehealth: Payer: Self-pay | Admitting: Radiation Oncology

## 2024-07-31 NOTE — Telephone Encounter (Signed)
 Pt left a vm that she needs to reschedule Dr.Chrystal's appt on 08/03/2024

## 2024-08-03 ENCOUNTER — Ambulatory Visit: Payer: BC Managed Care – PPO | Admitting: Radiation Oncology

## 2024-08-17 ENCOUNTER — Encounter: Payer: Self-pay | Admitting: Radiation Oncology

## 2024-08-17 ENCOUNTER — Ambulatory Visit
Admission: RE | Admit: 2024-08-17 | Discharge: 2024-08-17 | Disposition: A | Discharge status: Home / Self Care | Source: Ambulatory Visit | Attending: Radiation Oncology | Admitting: Radiation Oncology

## 2024-08-17 VITALS — BP 116/83 | HR 68 | Temp 97.0°F | Resp 14 | Ht 64.0 in | Wt 127.8 lb

## 2024-08-17 DIAGNOSIS — Z170 Estrogen receptor positive status [ER+]: Secondary | ICD-10-CM | POA: Diagnosis not present

## 2024-08-17 DIAGNOSIS — Z79811 Long term (current) use of aromatase inhibitors: Secondary | ICD-10-CM | POA: Insufficient documentation

## 2024-08-17 DIAGNOSIS — Z923 Personal history of irradiation: Secondary | ICD-10-CM | POA: Diagnosis not present

## 2024-08-17 DIAGNOSIS — D0511 Intraductal carcinoma in situ of right breast: Secondary | ICD-10-CM | POA: Diagnosis present

## 2024-08-17 NOTE — Progress Notes (Signed)
 Radiation Oncology Follow up Note  Name: Jacqueline Murray   Date:   08/17/2024 MRN:  [id number] DOB: 1966/05/27    This 58 y.o. female presents to the clinic today for 2-year follow-up status post whole breast radiation to her right breast for ER positive ductal carcinoma in situ.  REFERRING PROVIDER: Epifanio Alm SQUIBB, MD  HPI: Patient is a 58 year old female now out over 2 years having pleated whole breast radiation to her right breast for ER positive ductal carcinoma in situ.  Seen today in routine follow-up she is doing well.  She specifically denies breast tenderness cough or bone pain..  She is currently on Arimidex  tolerating it well without side effect.  She had mammograms back in April which I have reviewed were BI-RADS 2 benign.  COMPLICATIONS OF TREATMENT: none  FOLLOW UP COMPLIANCE: keeps appointments   PHYSICAL EXAM:  BP 116/83   Pulse 68   Temp (!) 97 F (36.1 C)   Resp 14   Ht 5' 4 (1.626 m)   Wt 127 lb 12.8 oz (58 kg)   BMI 21.94 kg/m  Lungs are clear to A&P cardiac examination essentially unremarkable with regular rate and rhythm. No dominant mass or nodularity is noted in either breast in 2 positions examined. Incision is well-healed. No axillary or supraclavicular adenopathy is appreciated. Cosmetic result is excellent.  Well-developed well-nourished patient in NAD. HEENT reveals PERLA, EOMI, discs not visualized.  Oral cavity is clear. No oral mucosal lesions are identified. Neck is clear without evidence of cervical or supraclavicular adenopathy. Lungs are clear to A&P. Cardiac examination is essentially unremarkable with regular rate and rhythm without murmur rub or thrill. Abdomen is benign with no organomegaly or masses noted. Motor sensory and DTR levels are equal and symmetric in the upper and lower extremities. Cranial nerves II through XII are grossly intact. Proprioception is intact. No peripheral adenopathy or edema is identified. No motor or sensory levels  are noted. Crude visual fields are within normal range.  RADIOLOGY RESULTS: Mammograms reviewed compatible with above-stated findings  PLAN: Present time she is doing well now out over 2 years with no evidence of disease.  She continues on Arimidex  without side effect.  I am going to turn follow-up care over to her other providers.  I would be happy to reevaluate her anytime the future should that be indicated.  Patient knows to call with any concerns at any time.  I would like to take this opportunity to thank you for allowing me to participate in the care of your patient.SABRA Marcey Penton, MD

## 2024-10-12 ENCOUNTER — Telehealth: Payer: Self-pay | Admitting: *Deleted

## 2024-10-12 NOTE — Telephone Encounter (Signed)
 The patient says that she never even remember that it was supposed to be in 6 months and she does not remember she had to get a set up for DEXA scan.  I told her that she could come tomorrow and see Dr. Babara and get the labs but then we will call him back whenever the DEXA has been set up and done.  Or you can call to and asked them about the nearest DEXA scan open.  And then we can push it out Dr. Rosine with the labs and the doctor until the DEXA 1 got read and then she had just come that 1 day to get all of that taken care of.  She would like to do the 1 where she gets going there and she will call [place] tomorrow to see when is the first available for the DEXA scan could be done.

## 2024-10-13 ENCOUNTER — Telehealth: Payer: Self-pay | Admitting: Oncology

## 2024-10-13 ENCOUNTER — Encounter: Payer: Self-pay | Admitting: Oncology

## 2024-10-13 ENCOUNTER — Inpatient Hospital Stay: Admitting: Oncology

## 2024-10-13 ENCOUNTER — Inpatient Hospital Stay: Attending: Oncology

## 2024-10-13 NOTE — Telephone Encounter (Signed)
 Order for DEXA faxed to [facility]. Per Rosina, pt will call back to rs once DEXA is scheduled.

## 2024-10-13 NOTE — Assessment & Plan Note (Deleted)
#  Right breast DCIS, ER positive. pTis pN0, status post lumpectomy followed by adjuvant radiation Patient is postmenopausal-FSH above 40 March 2024 Mammogram-5 mm intraductal mass in a focally dilated duct in the posterior aspect of the right nipple,s/p excisional biopsy 03/31/23  - atypical intraepithelial cells worrisome for pagetoid extension of underlying DCIS into lactiferous ducts of the nipple. MRI breast - Indeterminate 4-5 mm enhancing mass within/at the base of the right nipple 06/25/2023 s/p right breast lumpectomy, negative for atypia or malignancy  Labs are reviewed and discussed with patient. Continue Arimidex  1mg  daily. Rx sent Continue annual mammogram surveillance, next due in April 2026

## 2024-10-13 NOTE — Telephone Encounter (Signed)
 Spoke with pt about r/s missed appts from today. Pt stated she will call back once she has her DEXA appt. Pt stated she has our number to call to r/s appts

## 2024-10-16 ENCOUNTER — Other Ambulatory Visit: Payer: Self-pay

## 2024-10-16 MED ORDER — ANASTROZOLE 1 MG PO TABS: 1.0000 mg | ORAL_TABLET | Freq: Every day | ORAL | 1 refills | Status: DC

## 2024-10-25 ENCOUNTER — Telehealth: Payer: Self-pay | Admitting: Oncology

## 2024-10-25 ENCOUNTER — Encounter: Payer: Self-pay | Admitting: Oncology

## 2024-10-25 NOTE — Telephone Encounter (Signed)
 Per RN, pt DEXA scans are in. Reach out to pt to see if she wants to r/s lab/MD appts.   I called and left vm for pt to call back to r/s appts. Scheduling phone number provided

## 2024-12-08 ENCOUNTER — Inpatient Hospital Stay: Admitting: Oncology

## 2024-12-08 ENCOUNTER — Encounter: Payer: Self-pay | Admitting: Oncology

## 2024-12-08 ENCOUNTER — Inpatient Hospital Stay: Attending: Oncology

## 2024-12-08 VITALS — BP 126/90 | HR 70 | Temp 98.1°F | Resp 18 | Wt 129.2 lb

## 2024-12-08 DIAGNOSIS — Z9011 Acquired absence of right breast and nipple: Secondary | ICD-10-CM | POA: Insufficient documentation

## 2024-12-08 DIAGNOSIS — Z79811 Long term (current) use of aromatase inhibitors: Secondary | ICD-10-CM | POA: Insufficient documentation

## 2024-12-08 DIAGNOSIS — D649 Anemia, unspecified: Secondary | ICD-10-CM

## 2024-12-08 DIAGNOSIS — D0511 Intraductal carcinoma in situ of right breast: Secondary | ICD-10-CM

## 2024-12-08 DIAGNOSIS — R002 Palpitations: Secondary | ICD-10-CM | POA: Diagnosis not present

## 2024-12-08 LAB — CMP (CANCER CENTER ONLY)
ALT: 33 U/L (ref 0–44)
AST: 32 U/L (ref 15–41)
Albumin: 4.5 g/dL (ref 3.5–5.0)
Alkaline Phosphatase: 78 U/L (ref 38–126)
Anion gap: 12 (ref 5–15)
BUN: 14 mg/dL (ref 6–20)
CO2: 23 mmol/L (ref 22–32)
Calcium: 10.1 mg/dL (ref 8.9–10.3)
Chloride: 105 mmol/L (ref 98–111)
Creatinine: 1.06 mg/dL — ABNORMAL HIGH (ref 0.44–1.00)
GFR, Estimated: >60 mL/min
Glucose, Bld: 104 mg/dL — ABNORMAL HIGH (ref 70–99)
Potassium: 4.5 mmol/L (ref 3.5–5.1)
Sodium: 140 mmol/L (ref 135–145)
Total Bilirubin: 0.9 mg/dL (ref 0.0–1.2)
Total Protein: 7.4 g/dL (ref 6.5–8.1)

## 2024-12-08 LAB — CBC WITH DIFFERENTIAL (CANCER CENTER ONLY)
Abs Immature Granulocytes: 0 K/uL (ref 0.00–0.07)
Basophils Absolute: 0 K/uL (ref 0.0–0.1)
Basophils Relative: 1 %
Eosinophils Absolute: 0.1 K/uL (ref 0.0–0.5)
Eosinophils Relative: 2 %
HCT: 37.7 % (ref 36.0–46.0)
Hemoglobin: 11.9 g/dL — ABNORMAL LOW (ref 12.0–15.0)
Immature Granulocytes: 0 %
Lymphocytes Relative: 28 %
Lymphs Abs: 1.5 K/uL (ref 0.7–4.0)
MCH: 28.3 pg (ref 26.0–34.0)
MCHC: 31.6 g/dL (ref 30.0–36.0)
MCV: 89.5 fL (ref 80.0–100.0)
Monocytes Absolute: 0.5 K/uL (ref 0.1–1.0)
Monocytes Relative: 9 %
Neutro Abs: 3.3 K/uL (ref 1.7–7.7)
Neutrophils Relative %: 60 %
Platelet Count: 202 K/uL (ref 150–400)
RBC: 4.21 MIL/uL (ref 3.87–5.11)
RDW: 13.2 % (ref 11.5–15.5)
WBC Count: 5.4 K/uL (ref 4.0–10.5)
nRBC: 0 % (ref 0.0–0.2)

## 2024-12-08 MED ORDER — EXEMESTANE 25 MG PO TABS: 25.0000 mg | ORAL_TABLET | Freq: Every day | ORAL | 5 refills | Status: AC

## 2024-12-08 NOTE — Progress Notes (Signed)
 Pt here for follow up. Reports that she stopped taking Anastrozole  approx 3 weeks ago due to having the following symptoms: Severe fatigue, joint pain, pain to soles of feet. Symptoms resolved after stopping medication. No new breast concerns. Pt also reports having heart palpitations, not related to the medication

## 2024-12-08 NOTE — Assessment & Plan Note (Signed)
 Normal iron panel and vitamin B12 level.  Hemoglobin is mildly decreased but stable.  Monitor.

## 2024-12-08 NOTE — Progress Notes (Signed)
 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 pTis pN0, status post lumpectomy followed by adjuvant radiation Patient is postmenopausal-FSH above 40 March 2024 Mammogram-5 mm intraductal mass in a focally dilated duct in the posterior aspect of the right nipple,s/p excisional biopsy 03/31/23  - atypical intraepithelial cells worrisome for pagetoid extension of underlying DCIS into lactiferous ducts of the nipple. MRI breast - Indeterminate 4-5 mm enhancing mass within/at the base of the right nipple 06/25/2023 s/p right breast lumpectomy, negative for atypia or malignancy  Labs are reviewed and discussed with patient. Patient has stopped Arimidex  due to side effects.  Discussed about option to switching to other alternatives.  Recommend Aromasin 25 mg daily.  Prescription sent to pharmacy. Continue annual mammogram surveillance, next due in April 2026   Aromatase inhibitor use Continue calcium and vitamin D supplementation.  History of osteopenia[2019], 10/24/2024 DEXA - osteopenia.   Normocytic anemia Normal iron panel and vitamin B12 level.  Hemoglobin is mildly decreased but stable.  Monitor.  Palpitation Intermittent palpitation  recommend patient to discuss with primary care for further evaluation.   Orders Placed This Encounter  Procedures   MM 3D DIAGNOSTIC MAMMOGRAM BILATERAL BREAST    Standing Status:   Future    Expected Date:   04/12/2025    Expiration Date:   12/08/2025    Reason for Exam (SYMPTOM  OR DIAGNOSIS  REQUIRED):   Breast Cancer    Is the patient pregnant?:   No    Preferred imaging location?:   [facility]   CBC with Differential (Cancer Center Only)    Standing Status:   Future    Expected Date:   06/08/2025    Expiration Date:   09/06/2025   CMP (Cancer Center only)    Standing Status:   Future    Expected Date:   06/08/2025    Expiration Date:   09/06/2025   Follow up in  6 months.  All questions were answered. The patient knows to call the clinic with any problems, questions or concerns.  [name], MD, PhD [facility] Hematology Oncology 12/08/2024   Epifanio Alm SQUIBB, MD    HISTORY OF PRESENTING ILLNESS:   Jacqueline Murray is a  58 y.o.  female presents for follow up of DCIS right breast  Patient has a history of papilloma in 2021. 04/29/2020, bilateral breast MRI showed suspicious clumped non-mass enhancement at 12:00 in the right breast, and an indeterminate suspicious mass at the 9:00 in the right breast. 05/17/2020, 9:00 breast mass positive for papilloma with usual ductal hyperplasia.  12:00 mass was benign. 06/05/2020, right breast excisional biopsy showed benign breast tissue with intraductal papilloma, incidental fibroadenoma, fibrocystic changes, usual ductal hyperplasia, prior biopsy changes.  Negative for atypia and malignancy.  Right breast ductal excisional biopsy was negative for atypia and malignancy.  Patient complains of history of bloody right nipple discharge.  History of papilloma excised in 2021.  She also has a left breast mass was originally seen on 02/12/2020  study and being watched. 02/25/2022, bilateral breast mammogram showed Suspicious calcifications in the upper outer quadrant of the right breast; intra ductal mass in the 3:00 region of the right breast 3 cm from nipple.;  Stable benign-appearing mass in the 3: 30 region of the left breast.  Sonographic evaluation of the right axilla does not show any enlarged adenopathy.  03/09/2022, patient  underwent right breast 3:00 3 cm from the nipple, ultrasound-guided biopsy, pathology showed fragment of intraductal papilloma, with associated usual ductal hyperplasia.  Background mammary parenchyma with fibrocystic changes negative for atypical proliferative breast disease.;  Right upper outer quadrant calcification stereotactic core needle biopsy showed DCIS, high-grade with comedonecrosis.  Calcifications associated with DCIS.  Negative for invasive mammary carcinoma.  Patient has been seen by Dr. Cesar.  Recommended bilateral MRI for further evaluation of the extent of DCIS 03/20/2022, bilateral breast MRI 1. Large area of non mass enhancement within the superior central right breast adjacent to the marking clip, potentially representing additional DCIS adjacent to the recent site of biopsy. 2. Multiple enhancing masses are demonstrated throughout the right and left breast. These are indeterminate in etiology however may represent a background of papillomatosis. Additional etiologies not excluded. 3. Cortically thickened right axillary lymph node, indeterminate.  Patient was referred to establish care with oncology for further evaluation management.  Patient has no new complaints today.  Some soreness at the site of previous biopsy. Family history is positive for father with prostate cancer.  No family history of breast cancer.  Menarche 58 years of age Age at first birth 9, she has 1 daughter. Patient has taken OCP 20+ years. History of hysterectomy. + Hormone replacement therapy since 2016 /2017, recently stopped Previous right breast excisional biopsy-papilloma  03/20/2022, bilateral breast MRI showed additional non-mass enhancement adjacent to the biopsy area, as well as multiple enhancing masses throughout the right and left breast.  Cortically thickened right axillary lymph node. Patient will get additional biopsy-MRI biopsy of the non-mass enhancement within the superior central  right breast, dominant mass within the lower outer right breast, and dominant mass in the left breast.  Ultrasound for second look at the axillary lymph node.  04/09/22, additional fine-needle biopsy right breast superior central core needle biopsy showed usual ductal hyperplasia, columnar cells and fibrocystic changes.  Fat necrosis and foreign body giant cell reaction.  No malignancy identified. Right breast lower outer needle biopsy showed usual ductal hyperplasia and fibrocystic changes.  No malignancy identified. Left breast lower outer needle biopsy showed usual ductal hyperplasia and columnar cell and fibrocystic changes.  No malignancy identified. All were found to be concordant by Dr. Bard Moats.  04/30/2022, second look right axilla showed no suspicious right axillary lymphadenopathy.  Biopsy was canceled  05/06/2022, patient underwent lumpectomy. Right breast partial mastectomy showed a single foci of low-grade DCIS in situ, 2.5 mm, completely excised.  Single separated focus of atypical ductal hyperplasia.  Background benign mammary parenchyma with intraductal papilloma, focally sclerosing, fibrocystic changes, and florid usual ductal hyperplasia.  Negative for malignancy and residual high-grade ductal carcinoma in situ.  Sentinel lymph node biopsy showed 4 lymph nodes all negative for malignancy.  Additional right breast excision showed benign mammary parenchyma with intraductal papilloma, associated florid usual ductal hyperplasia.  Negative for atypical perforated breast disease. pTis pN0, estrogen receptor 90% positive.  #Family history of cancer, Patient has establish care with genetic counselor and had [employer] done.  No pathological mutations.she has PALB2 VUS.   06/04/2022 - 06/26/2022  status post adjuvant radiation 07/13/22 started on Arimidex   08/07/22 DEXA normal bone density   03/01/2023 Mammogram showed 5 mm intraductal mass in a focally dilated duct in the posterior  aspect of the right nipple,s/p excisional biopsy 03/31/23  - atypical intraepithelial cells worrisome for pagetoid extension of underlying DCIS into lactiferous ducts of the nipple.  03/31/23  - atypical intraepithelial cells worrisome for pagetoid extension of underlying DCIS into lactiferous ducts of the nipple. MRI breast - Indeterminate 4-5 mm enhancing mass within/at the base of the right nipple 06/25/2023 s/p right breast lumpectomy, negative for atypia or malignancy   INTERVAL HISTORY  Jacqueline Murray is a 59 y.o. female who has above history reviewed by me today presents for follow up visit for management of right breast DCIS Discussed the use of AI scribe software for clinical note transcription with the patient, who gave verbal consent to proceed.   She discontinued anastrozole  three weeks ago after experiencing significant fatigue, arthralgias, and plantar pain, which progressively impaired daily functioning. Since cessation of anastrozole , she reports marked improvement in energy and resolution of musculoskeletal symptoms.  She denies current breast symptoms and has not noted any new findings suggestive of disease recurrence.  Over the past two months, she has experienced intermittent palpitations at rest, without associated anxiety or identifiable triggers. She has not previously sought evaluation and was advised to follow up with her primary care provider.   Review of Systems  Constitutional:  Negative for appetite change, chills, fatigue and fever.  HENT:   Negative for hearing loss and voice change.   Eyes:  Negative for eye problems.  Respiratory:  Negative for chest tightness and cough.   Cardiovascular:  Positive for palpitations. Negative for chest pain.  Gastrointestinal:  Negative for abdominal distention, abdominal pain and blood in stool.  Endocrine: Negative for hot flashes.  Genitourinary:  Negative for difficulty urinating and frequency.   Musculoskeletal:  Negative  for arthralgias.  Skin:  Negative for itching and rash.  Neurological:  Negative for extremity weakness.  Hematological:  Negative for adenopathy.  Psychiatric/Behavioral:  Negative for confusion.     MEDICAL HISTORY:  Past Medical History:  Diagnosis Date   Anemia    Arthritis    Colon polyp 04/19/2020   Complication of anesthesia    Ductal carcinoma in situ (DCIS) of right breast    Dysfunctional uterine bleeding    GERD (gastroesophageal reflux disease)    Headache    HLD (hyperlipidemia)    Hypertension    IBS (irritable bowel syndrome)    Intraductal papilloma of breast 01/21/2012   left breast   Intraductal papilloma of breast, right    PONV (postoperative nausea and vomiting)    EXTREME NAUSEA   Pure hypercholesterolemia     SURGICAL HISTORY: Past Surgical History:  Procedure Laterality Date   ABDOMINAL HYSTERECTOMY  2016   BREAST BIOPSY Bilateral few yrs ago   benign-cyst   BREAST BIOPSY Right 1986   fibroadenoma   BREAST BIOPSY Right 05/17/2020   neg/ MRI bx 2 areas   BREAST BIOPSY Right 03/09/2022   rt breast stereo calcs x clip path pending   BREAST BIOPSY Right 03/03/2023   US  RT BREAST BX W LOC DEV 1ST LESION IMG BX SPEC US  GUIDE 03/03/2023 [facility]   BREAST BIOPSY WITH RADIO FREQUENCY LOCALIZER Right 06/05/2020   Procedure: BREAST BIOPSY WITH RADIO FREQUENCY LOCALIZER;  Surgeon: Rodolph Romano, MD;  Location: [facility] OR[facility];  Service: General;  Laterality: Right;  breast biposy Right 03/09/2022   u/s core bc heart clip path pending   BREAST EXCISIONAL BIOPSY Right 05/26/2020   neg   BREAST EXCISIONAL BIOPSY Left 2013   neg   CESAREAN SECTION  06/17/1994   COLONOSCOPY  2011   COLONOSCOPY  09/25/2020   EXCISION OF BREAST BIOPSY Right 03/31/2023   Procedure: EXCISION OF BREAST BIOPSY;  Surgeon: Rodolph Romano, MD;  Location: [facility];  Service: General;  Laterality: Right;   EXCISION OF BREAST BIOPSY Right 06/25/2023   Procedure:  EXCISION OF BREAST BIOPSY;  Surgeon: Rodolph Romano, MD;  Location: [facility];  Service: General;  Laterality: Right;   PARTIAL MASTECTOMY WITH AXILLARY SENTINEL LYMPH NODE BIOPSY Right 05/06/2022   Procedure: PARTIAL MASTECTOMY WITH AXILLARY SENTINEL LYMPH NODE BIOPSY With RF tag;  Surgeon: Rodolph Romano, MD;  Location: [facility] OR[facility];  Service: General;  Laterality: Right;    SOCIAL HISTORY: Social History   Socioeconomic History   Marital status: Divorced    Spouse name: Not on file   Number of children: 1   Years of education: Not on file   Highest education level: Not on file  Occupational History   Not on file  Tobacco Use   Smoking status: Never    Passive exposure: Never   Smokeless tobacco: Never  Vaping Use   Vaping status: Never Used  Substance and Sexual Activity   Alcohol use: Not Currently   Drug use: Never   Sexual activity: Not Currently  Other Topics Concern   Not on file  Social History Narrative   Lives alone   Social Drivers of Health   Tobacco Use: Low Risk (12/08/2024)   Patient History    Smoking Tobacco Use: Never    Smokeless Tobacco Use: Never    Passive Exposure: Never  Financial Resource Strain: Low Risk  (04/12/2024)   Received from [facility]   Overall Financial Resource Strain (CARDIA)    Difficulty of Paying Living Expenses: Not hard at all  Food Insecurity: No Food Insecurity (04/12/2024)   Received from [facility]   Epic    Within the past 12 months, you worried that your food would run out before you got the money to buy more.: Never true    Within the past 12 months, the food you bought just didn't last and you didn't have money to get more.: Never true  Transportation Needs: No Transportation Needs (04/12/2024)   Received from [facility] - Transportation    In the past 12 months, has lack of transportation kept you from medical appointments or from getting  medications?: No    Lack of Transportation (Non-Medical): No  Physical Activity: Not on file  Stress: Not on file  Social Connections: Not on file  Intimate Partner Violence: Not on file  Depression (PHQ2-9): Low Risk (08/17/2024)   Depression (PHQ2-9)    PHQ-2 Score: 0  Alcohol Screen: Not on file  Housing: Unknown (04/12/2024)   Received from [facility]   Epic    At any time in the past 12 months, were you homeless or living in a shelter (including now)?: No    Number of Times Moved in the Last Year: Not on file    In the last 12 months, was there a time when you were not able to pay the mortgage or rent on time?: No  Utilities: Not At Risk (04/12/2024)   Received from [facility]   [employer] Utilities    Threatened with loss of utilities: No  Health Literacy: Not on file    FAMILY HISTORY: Family History  Problem Relation Age of Onset   Diabetes Mellitus II Mother    Heart disease Mother    Prostate cancer Father 33       metastatic   Hypertension Father    CAD Father    Hypertension Sister    Hyperlipidemia Sister    CAD Brother    Diabetes Mellitus II Brother    Heart disease Brother    Kidney disease Brother    Breast cancer Neg Hx     ALLERGIES:  is allergic to ace inhibitors, latex, povidone iodine, shellfish protein-containing drug products, and tape.  MEDICATIONS:  Current Outpatient Medications  Medication Sig Dispense Refill   Calcium Carbonate (CALTRATE 600 PO) Take 1,200 mg by mouth daily.     exemestane (AROMASIN) 25 MG tablet Take 1 tablet (25 mg total) by mouth daily after breakfast. 30 tablet 5   loratadine (CLARITIN) 10 MG tablet Take 10 mg by mouth at bedtime.     montelukast (SINGULAIR) 10 MG tablet Take 10 mg by mouth at bedtime.     Multiple Vitamins-Minerals (MULTIVITAMIN ADULT, MINERALS,) TABS Take 1 tablet by mouth at bedtime.     simvastatin (ZOCOR) 20 MG tablet Take 20 mg by mouth at bedtime.      spironolactone (ALDACTONE) 100 MG tablet Take 100 mg by mouth daily.     triamcinolone  (NASACORT ) 55 MCG/ACT AERO nasal inhaler Place 2 sprays into the nose daily.      No current facility-administered medications for this visit.     PHYSICAL EXAMINATION: ECOG PERFORMANCE STATUS: 0 - Asymptomatic Vitals:   12/08/24 0959  BP: (!) 126/90  Pulse: 70  Resp: 18  Temp: 98.1 F (36.7 C)   Filed Weights   12/08/24 0959  Weight: 129 lb 3.2 oz (58.6 kg)    Physical Exam Constitutional:      General: She is not in acute distress. HENT:     Head: Normocephalic and atraumatic.  Eyes:     General: No scleral icterus. Cardiovascular:     Rate and Rhythm: Normal rate and regular rhythm.     Heart sounds: Normal heart sounds.  Pulmonary:     Effort: Pulmonary effort is normal. No respiratory distress.     Breath sounds: Normal breath sounds. No wheezing.  Abdominal:     General: Bowel sounds are normal. There is no distension.     Palpations: Abdomen is soft.  Musculoskeletal:        General: Normal range of motion.     Cervical back: Normal range of motion and neck supple.  Skin:    General: Skin is warm and dry.     Findings: No erythema or rash.  Neurological:     Mental Status: She is alert and oriented to person, place, and time. Mental status is at baseline.     Cranial Nerves: No cranial nerve deficit.     Coordination: Coordination normal.  Psychiatric:        Mood and Affect: Mood normal.      LABORATORY DATA:  I have reviewed the data as listed    Latest Ref Rng & Units 12/08/2024    9:46 AM 04/13/2024   10:22 AM 10/14/2023   10:01 AM  CBC  WBC 4.0 - 10.5 K/uL 5.4  TEST CANCELLED PER RN  6.2   Hemoglobin  12.0 - 15.0 g/dL 88.0  TEST CANCELLED PER RN  11.9   Hematocrit 36.0 - 46.0 % 37.7  TEST CANCELLED PER RN  37.8   Platelets 150 - 400 K/uL 202  TEST CANCELLED PER RN  188       Latest Ref Rng & Units 12/08/2024    9:46 AM 10/14/2023   10:00 AM 04/14/2023     9:55 AM  CMP  Glucose 70 - 99 mg/dL 895  898  866   BUN 6 - 20 mg/dL 14  16  17    Creatinine 0.44 - 1.00 mg/dL 8.93  8.97  9.00   Sodium 135 - 145 mmol/L 140  138  136   Potassium 3.5 - 5.1 mmol/L 4.5  4.6  4.5   Chloride 98 - 111 mmol/L 105  107  103   CO2 22 - 32 mmol/L 23  25  24    Calcium 8.9 - 10.3 mg/dL 89.8  9.6  9.6   Total Protein 6.5 - 8.1 g/dL 7.4  7.3  8.1   Total Bilirubin 0.0 - 1.2 mg/dL 0.9  1.1  1.1   Alkaline Phos 38 - 126 U/L 78  59  66   AST 15 - 41 U/L 32  26  27   ALT 0 - 44 U/L 33  24  30      RADIOGRAPHIC STUDIES: I have personally reviewed the radiological images as listed and agreed with the findings in the report. No results found.   "

## 2024-12-08 NOTE — Assessment & Plan Note (Addendum)
#  Right breast DCIS, ER positive. pTis pN0, status post lumpectomy followed by adjuvant radiation Patient is postmenopausal-FSH above 40 March 2024 Mammogram-5 mm intraductal mass in a focally dilated duct in the posterior aspect of the right nipple,s/p excisional biopsy 03/31/23  - atypical intraepithelial cells worrisome for pagetoid extension of underlying DCIS into lactiferous ducts of the nipple. MRI breast - Indeterminate 4-5 mm enhancing mass within/at the base of the right nipple 06/25/2023 s/p right breast lumpectomy, negative for atypia or malignancy  Labs are reviewed and discussed with patient. Patient has stopped Arimidex  due to side effects.  Discussed about option to switching to other alternatives.  Recommend Aromasin 25 mg daily.  Prescription sent to pharmacy. Continue annual mammogram surveillance, next due in April 2026

## 2024-12-08 NOTE — Assessment & Plan Note (Signed)
 Continue calcium and vitamin D supplementation.  History of osteopenia[2019], 10/24/2024 DEXA - osteopenia.

## 2024-12-08 NOTE — Assessment & Plan Note (Signed)
 Intermittent palpitation  recommend patient to discuss with primary care for further evaluation.

## 2024-12-11 ENCOUNTER — Other Ambulatory Visit: Payer: Self-pay | Admitting: Oncology

## 2024-12-11 DIAGNOSIS — D0511 Intraductal carcinoma in situ of right breast: Secondary | ICD-10-CM

## 2025-06-12 ENCOUNTER — Inpatient Hospital Stay

## 2025-06-12 ENCOUNTER — Inpatient Hospital Stay: Admitting: Oncology
